# Patient Record
Sex: Male | Born: 1985
Health system: Southern US, Community
[De-identification: ages and names within clinical notes are randomized; demographics above are authoritative.]

## PROBLEM LIST (undated history)

## (undated) DIAGNOSIS — H409 Unspecified glaucoma: Secondary | ICD-10-CM

## (undated) DIAGNOSIS — F840 Autistic disorder: Secondary | ICD-10-CM

## (undated) HISTORY — DX: Unspecified glaucoma: H40.9

## (undated) HISTORY — PX: PATENT DUCTUS ARTERIOUS REPAIR: SHX269

---
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
## 2007-09-23 ENCOUNTER — Ambulatory Visit: Payer: Self-pay | Admitting: Pulmonary Disease

## 2007-09-23 DIAGNOSIS — J309 Allergic rhinitis, unspecified: Secondary | ICD-10-CM

## 2007-09-23 DIAGNOSIS — J4540 Moderate persistent asthma, uncomplicated: Secondary | ICD-10-CM

## 2007-10-01 ENCOUNTER — Ambulatory Visit: Payer: Self-pay | Admitting: Pulmonary Disease

## 2007-10-10 ENCOUNTER — Telehealth (INDEPENDENT_AMBULATORY_CARE_PROVIDER_SITE_OTHER): Payer: Self-pay | Admitting: *Deleted

## 2007-10-16 ENCOUNTER — Encounter (INDEPENDENT_AMBULATORY_CARE_PROVIDER_SITE_OTHER): Payer: Self-pay | Admitting: *Deleted

## 2007-12-19 ENCOUNTER — Ambulatory Visit: Payer: Self-pay | Admitting: Pulmonary Disease

## 2008-01-28 ENCOUNTER — Encounter: Payer: Self-pay | Admitting: Pulmonary Disease

## 2008-01-30 ENCOUNTER — Encounter: Payer: Self-pay | Admitting: Pulmonary Disease

## 2008-03-01 ENCOUNTER — Ambulatory Visit: Payer: Self-pay | Admitting: Pulmonary Disease

## 2008-06-02 ENCOUNTER — Ambulatory Visit: Payer: Self-pay | Admitting: Pulmonary Disease

## 2008-12-10 ENCOUNTER — Ambulatory Visit: Payer: Self-pay | Admitting: Pulmonary Disease

## 2009-08-30 ENCOUNTER — Ambulatory Visit (HOSPITAL_BASED_OUTPATIENT_CLINIC_OR_DEPARTMENT_OTHER): Admission: RE | Admit: 2009-08-30 | Discharge: 2009-08-30 | Payer: Self-pay | Admitting: Surgery

## 2010-02-09 ENCOUNTER — Ambulatory Visit (HOSPITAL_BASED_OUTPATIENT_CLINIC_OR_DEPARTMENT_OTHER): Admission: RE | Admit: 2010-02-09 | Discharge: 2010-02-09 | Payer: Self-pay | Admitting: Surgery

## 2010-09-12 ENCOUNTER — Ambulatory Visit
Admission: RE | Admit: 2010-09-12 | Discharge: 2010-09-12 | Payer: Self-pay | Source: Home / Self Care | Attending: Pulmonary Disease | Admitting: Pulmonary Disease

## 2010-09-20 NOTE — Assessment & Plan Note (Signed)
Summary: rov for asthma   Copy to:  Colonel Bald  CC:  Overdue for f/u appt for asthma.  Last seen April 2010.  Requesting rx on Symbicort.   Denies any complaints. .  History of Present Illness: the pt comes in today for f/u of his known asthma.  He has not been seen since 2010, and recently ran out of his symbicort with increased albuterol use.  When he stays on symbicort regularly, his symptoms are well controlled.  He denies any chest congestion or purulence.  He feels is exertional tolerance is stable.  Medications Prior to Update: 1)  Provigil 200 Mg  Tabs (Modafinil) .... Take 1 Tablet By Mouth Two Times A Day 2)  Symbicort 160-4.5 Mcg/act  Aero (Budesonide-Formoterol Fumarate) .... Two Puffs Twice Daily 3)  Travatan 0.004 % Soln (Travoprost) .Marland Kitchen.. 1 Drop in Each Eye Once Daily 4)  Proair Hfa 108 (90 Base) Mcg/act  Aers (Albuterol Sulfate) .... 2 Puffs Every 4-6 Hours As Needed  Allergies (verified): No Known Drug Allergies  Review of Systems  The patient denies shortness of breath with activity, shortness of breath at rest, productive cough, non-productive cough, coughing up blood, chest pain, irregular heartbeats, acid heartburn, indigestion, loss of appetite, weight change, abdominal pain, difficulty swallowing, sore throat, tooth/dental problems, headaches, nasal congestion/difficulty breathing through nose, sneezing, itching, ear ache, anxiety, depression, hand/feet swelling, joint stiffness or pain, rash, change in color of mucus, and fever.    Vital Signs:  Patient profile:   25 year old male Height:      67 inches Weight:      156.38 pounds BMI:     24.58 O2 Sat:      100 % on 75 Temp:     98.0 degrees F oral BP sitting:   100 / 70  (right arm) Cuff size:   regular  Vitals Entered By: Arman Filter LPN (September 12, 2010 11:16 AM)  O2 Flow:  75 CC: Overdue for f/u appt for asthma.  Last seen April 2010.  Requesting rx on Symbicort.   Denies any complaints.    Comments Medications reviewed with patient Arman Filter LPN  September 12, 2010 11:19 AM    Physical Exam  General:  wd male in nad Lungs:  one isolated wheeze LUL, o/w clear good airflow bilat. Heart:  rrr Extremities:  no edema or cyanosis  Neurologic:  alert and oriented, moves all 4.   Impression & Recommendations:  Problem # 1:  ASTHMA (ICD-493.90) the pt was doing well with symbicort, but had increased symptoms with increased albuterol use most recently since he ran out of his meds.  I have reiterated the importance of staying on his maintenance inhaler, and have given him refills for the next year.  He is to call me if he has breathing issues before the next visit.  Other Orders: Est. Patient Level III (10272)  Patient Instructions: 1)  stay on symbicort everyday. 2)  if you are having to use albuterol more than 2 times a week, let me know 3)  followup with me in one year.  Prescriptions: PROAIR HFA 108 (90 BASE) MCG/ACT  AERS (ALBUTEROL SULFATE) 2 puffs every 4-6 hours as needed  #1 x 12   Entered and Authorized by:   Barbaraann Share MD   Signed by:   Barbaraann Share MD on 09/12/2010   Method used:   Print then Give to Patient   RxID:   [id number] SYMBICORT 160-4.5 MCG/ACT  AERO (BUDESONIDE-FORMOTEROL FUMARATE) Two puffs twice daily  #1 x 12   Entered and Authorized by:   Barbaraann Share MD   Signed by:   Barbaraann Share MD on 09/12/2010   Method used:   Print then Give to Patient   RxID:   [id number]

## 2010-10-29 LAB — POCT HEMOGLOBIN-HEMACUE: Hemoglobin: 16 g/dL (ref 13.0–17.0)

## 2010-10-30 LAB — POCT HEMOGLOBIN-HEMACUE: Hemoglobin: 16 g/dL (ref 13.0–17.0)

## 2010-12-29 NOTE — Op Note (Signed)
NAME:  Grant [name], Grant [name]                      ACCOUNT NO.:  [id number]   MEDICAL RECORD NO.:  [id number]                   PATIENT TYPE:  AMB   LOCATION:  [facility]                                  FACILITY:  [facility]   PHYSICIAN:  Margit Banda. Jearld Fenton, M.D.                 DATE OF BIRTH:  1986/01/08   DATE OF PROCEDURE:  05/14/2002  DATE OF DISCHARGE:                                 OPERATIVE REPORT   PREOPERATIVE DIAGNOSIS:  Left vocal cord paralysis.   POSTOPERATIVE DIAGNOSIS:  Left vocal cord paralysis.   SURGICAL PROCEDURE:  Left laryngoplasty.   ANESTHESIA:  General endotracheal tube.   ESTIMATED BLOOD LOSS:  Less than 10 cc.   INDICATIONS:  This is a 25 year old who has had a long history of presumed  vocal cord paralysis as his voice has been bad as long as the parents can  remember.  He was seen in my office recently and found the vocal cord  paralysis and subsequently had workup with CT scan which showed no evidence  of any mass from base of skull to chest.  He now has had difficulty with  speaking and pronunciating, enunciating loudly.  He has difficulty speaking  on the phone.  The parents are very much interested in a laryngoplasty.  They were informed of the risks and benefits of the procedure including  bleeding, infection, extrusion of the implant, worsen hoarseness, airway  difficulty, and risk of the anesthetic.  All questions were answered and  consent was obtained.   OPERATION:  The patient was taken to the operating room and placed in the  supine position.  After adequate local with sedation, the patient had  ephedrine pledgets placed into the nose and fiberoptic examination to  examine the larynx which showed left vocal cord paralysis confirmation.  The  patient was then prepped and draped in the usual sterile manner.  Lidocaine,  1%, with 1:100,000 epinephrine was injected into the neck.   An incision was made in a skin crease just at the level of the thyroid  cartilage.  Dissection was carried down to the strap muscle.  The midline of  the strap muscles was divided and the perichondrium was removed off the  larynx on the left side.  A 5-mm x 1-cm box at the inferior base of the  larynx was created with a 15-blade.  The cartilage was very soft.  This  cartilage block was then removed and the perichondrium on the medial aspect  was elevated off the larynx and elevated medially.  An implant was then  fashioned with the Shaw scalpel to accommodate this opening.  The implant  was tested, placed in the site and, both with fiberoptic examination and  with phonation, it seemed to be excellent except for a little tightness  anteriorly.  This was trimmed further anteriorly to thin it so it would not  medialize the anterior portion of  the cord as much.  This was then replaced  and it was seen to have a good position with the vocal cord.  There was some  bruising of the vocal cord but no evidence of bleeding or hematoma.  The  implant was then secured to the anterior portion of the window with a 4-0  nylon suture.  The strap muscles were  then closed with interrupted chromic and the skin, with a rubber band drain  placed around the implant and out the skin, was secured with a 5-0 nylon and  interrupted 5-0 nylon used to close the skin.  The patient was then fully  awake and brought to recovery in stable condition.  Counts correct.                                               Margit Banda. Jearld Fenton, M.D.    JMB/MEDQ  D:  05/14/2002  T:  05/15/2002  Job:  [id number]   cc:   Deanna Artis. Sharene Skeans, M.D.  [address]  [place]  [place] [address]  Fax: [phone]

## 2011-09-12 ENCOUNTER — Encounter: Payer: Self-pay | Admitting: Pulmonary Disease

## 2011-09-13 ENCOUNTER — Ambulatory Visit: Payer: Self-pay | Admitting: Pulmonary Disease

## 2011-09-17 ENCOUNTER — Ambulatory Visit (INDEPENDENT_AMBULATORY_CARE_PROVIDER_SITE_OTHER): Payer: 59 | Admitting: Pulmonary Disease

## 2011-09-17 ENCOUNTER — Encounter: Payer: Self-pay | Admitting: Pulmonary Disease

## 2011-09-17 VITALS — BP 120/74 | HR 71 | Temp 98.5°F | Ht 67.0 in | Wt 159.6 lb

## 2011-09-17 DIAGNOSIS — J45909 Unspecified asthma, uncomplicated: Secondary | ICD-10-CM

## 2011-09-17 MED ORDER — BUDESONIDE-FORMOTEROL FUMARATE 160-4.5 MCG/ACT IN AERO: 2.0000 | INHALATION_SPRAY | Freq: Two times a day (BID) | RESPIRATORY_TRACT | Status: DC

## 2011-09-17 MED ORDER — ALBUTEROL SULFATE HFA 108 (90 BASE) MCG/ACT IN AERS: 2.0000 | INHALATION_SPRAY | Freq: Four times a day (QID) | RESPIRATORY_TRACT | Status: DC | PRN

## 2011-09-17 NOTE — Patient Instructions (Signed)
Continue your current medications Will send in your prescriptions for your asthma meds followup with me in one year, but call if having breathing issues.

## 2011-09-17 NOTE — Assessment & Plan Note (Signed)
The patient is doing very well from an asthma standpoint on his current regimen.  He rarely uses his rescue inhaler, and has not had any asthma flareups.  I have asked him to continue his current regimen, and to stay as active as possible.

## 2011-09-17 NOTE — Progress Notes (Signed)
  Subjective:    Patient ID: Grant [name], male    DOB: 08-22-1985, 26 y.o.   MRN: [id number]  HPI Patient comes in today for followup of his known asthma.  He has been staying on a good bronchodilator regimen, and feels that he rarely uses his rescue inhaler.  He denies any issues with his exertional tolerance, and has not had any acute exacerbations since the last visit here.   Review of Systems  Constitutional: Negative for fever and unexpected weight change.  HENT: Positive for congestion and rhinorrhea. Negative for ear pain, nosebleeds, sore throat, sneezing, trouble swallowing, dental problem, postnasal drip and sinus pressure.   Eyes: Negative for redness and itching.  Respiratory: Negative for cough, chest tightness, shortness of breath and wheezing.   Cardiovascular: Negative for palpitations and leg swelling.  Gastrointestinal: Negative for nausea and vomiting.  Genitourinary: Negative for dysuria.  Musculoskeletal: Negative for joint swelling.  Skin: Negative for rash.  Neurological: Negative for headaches.  Hematological: Does not bruise/bleed easily.  Psychiatric/Behavioral: Negative for dysphoric mood. The patient is not nervous/anxious.        Objective:   Physical Exam Well-developed male in no acute distress Nose without purulence or discharge noted Chest totally clear to auscultation, no wheezing Heart exam with regular rate and rhythm Lower extremities without edema, no cyanosis noted Alert and oriented, moves all 4 extremities.       Assessment & Plan:

## 2011-10-28 ENCOUNTER — Other Ambulatory Visit: Payer: Self-pay | Admitting: Pulmonary Disease

## 2011-11-09 DIAGNOSIS — G47429 Narcolepsy in conditions classified elsewhere without cataplexy: Secondary | ICD-10-CM | POA: Diagnosis not present

## 2011-11-09 DIAGNOSIS — Z5181 Encounter for therapeutic drug level monitoring: Secondary | ICD-10-CM | POA: Diagnosis not present

## 2012-09-16 ENCOUNTER — Ambulatory Visit: Payer: Medicare Other | Admitting: Pulmonary Disease

## 2012-12-09 ENCOUNTER — Other Ambulatory Visit: Payer: Self-pay | Admitting: Pulmonary Disease

## 2013-02-06 ENCOUNTER — Other Ambulatory Visit: Payer: Self-pay | Admitting: Neurology

## 2013-02-06 MED ORDER — MODAFINIL 200 MG PO TABS: ORAL_TABLET | ORAL | Status: DC

## 2013-03-06 ENCOUNTER — Telehealth: Payer: Self-pay | Admitting: Neurology

## 2013-03-29 ENCOUNTER — Other Ambulatory Visit: Payer: Self-pay | Admitting: Pulmonary Disease

## 2013-04-24 ENCOUNTER — Telehealth: Payer: Self-pay | Admitting: *Deleted

## 2013-04-24 NOTE — Telephone Encounter (Signed)
ATC patients mother, no answer LMOMTCB  Note: patient has not been seen in over 1 year (09/17/11)

## 2013-04-25 ENCOUNTER — Other Ambulatory Visit: Payer: Self-pay | Admitting: Pulmonary Disease

## 2013-04-25 ENCOUNTER — Other Ambulatory Visit: Payer: Self-pay | Admitting: Neurology

## 2013-04-27 ENCOUNTER — Telehealth: Payer: Self-pay | Admitting: Pulmonary Disease

## 2013-04-27 NOTE — Telephone Encounter (Signed)
lmomtcb x1 for pt. Pt is overdue for OV

## 2013-04-28 NOTE — Telephone Encounter (Signed)
LMTCB

## 2013-04-29 NOTE — Telephone Encounter (Signed)
ATC patients mother, no answer LMOMTCB

## 2013-04-29 NOTE — Telephone Encounter (Signed)
ATC patient no answer LMOMTCB 

## 2013-04-30 MED ORDER — BUDESONIDE-FORMOTEROL FUMARATE 160-4.5 MCG/ACT IN AERO: INHALATION_SPRAY | RESPIRATORY_TRACT | Status: DC

## 2013-04-30 NOTE — Telephone Encounter (Signed)
ATC patients mother NO answer LMOMTCB

## 2013-04-30 NOTE — Telephone Encounter (Signed)
LMOMTCB

## 2013-04-30 NOTE — Telephone Encounter (Signed)
Spoke with pt's mother who was very upset that pt has been out of symbicort for "weeks because we "haven't responded to the pharm."  Per phone msg from 04/24/13, our office had tried to contact pt's mother several times.  I advised her of this.  She was upset that we didn't leave msg.  I apologized for this but advised we do not have a DPR on file allowing [place] to leave a detailed msg.  Advised pt hasn't been seen by [facility] since 09/2011 at which time he was asked to f/u in 1 yr.  Pt has no pending OV, so I advised protocol on this.  Mother ok with scheduling OV with [facility] and is aware I will send symbicort to last until this OV, but pt will have to keep this OV for any additional rxs.  She verbalized understanding, is aware of pt's pending OV with [facility] on Jun 02, 2013 at 3:45 pm, and will call back if anything further is needed.  [facility]  Mother aware.

## 2013-05-01 NOTE — Telephone Encounter (Signed)
ATC patients mother at # provided no answer Also called numbers listed in Epic no answer [facility]  Per triage protocol will sign off as we have made several attempts to contact

## 2013-05-12 ENCOUNTER — Other Ambulatory Visit: Payer: Self-pay | Admitting: Neurology

## 2013-06-02 ENCOUNTER — Ambulatory Visit (INDEPENDENT_AMBULATORY_CARE_PROVIDER_SITE_OTHER): Payer: Medicare Other | Admitting: Pulmonary Disease

## 2013-06-02 ENCOUNTER — Encounter: Payer: Self-pay | Admitting: Pulmonary Disease

## 2013-06-02 VITALS — BP 130/68 | HR 84 | Temp 98.8°F | Ht 67.0 in | Wt 169.0 lb

## 2013-06-02 DIAGNOSIS — J45901 Unspecified asthma with (acute) exacerbation: Secondary | ICD-10-CM | POA: Diagnosis not present

## 2013-06-02 DIAGNOSIS — J45909 Unspecified asthma, uncomplicated: Secondary | ICD-10-CM | POA: Insufficient documentation

## 2013-06-02 MED ORDER — PREDNISONE 10 MG PO TABS: ORAL_TABLET | ORAL | Status: DC

## 2013-06-02 MED ORDER — AZITHROMYCIN 250 MG PO TABS: 250.0000 mg | ORAL_TABLET | ORAL | Status: DC

## 2013-06-02 NOTE — Patient Instructions (Signed)
Will get you started back on symbicort, 2 inhalations twice a day.  Keep mouth rinsed. zpak as directed for your bronchitis. Prednisone taper over 6 days to get you back to baseline.  followup with me in one year if doing well.

## 2013-06-02 NOTE — Assessment & Plan Note (Signed)
The pt has had increased sob, but also a hx that is suggestive of acute bronchitis.  Will treat with a course of abx, as well as a 6day taper of prednisone.  He is to let us know if not better.

## 2013-06-02 NOTE — Progress Notes (Signed)
  Subjective:    Patient ID: Grant [name], male    DOB: December 10, 1985, 27 y.o.   MRN: [id number]  HPI Patient comes in today for followup of his known asthma.  He unfortunately has run out of symbicort, but has been taking his family members Qvar in the interim.  Unfortunately, he has developed chest congestion, cough with some discolored mucus, and increasing shortness of breath.  He has not had any fever.   Review of Systems  Constitutional: Positive for fatigue. Negative for fever and unexpected weight change.  HENT: Negative for congestion, dental problem, ear pain, nosebleeds, postnasal drip, rhinorrhea, sinus pressure, sneezing, sore throat and trouble swallowing.   Eyes: Negative for redness and itching.  Respiratory: Positive for cough. Negative for chest tightness, shortness of breath and wheezing.   Cardiovascular: Negative for palpitations and leg swelling.  Gastrointestinal: Negative for nausea and vomiting.  Genitourinary: Negative for dysuria.  Musculoskeletal: Negative for joint swelling.  Skin: Positive for rash.  Neurological: Negative for headaches.  Hematological: Does not bruise/bleed easily.  Psychiatric/Behavioral: Negative for dysphoric mood. The patient is not nervous/anxious.        Objective:   Physical Exam Well developed male in no acute distress Nose without purulence or discharge noted Neck without lymphadenopathy or thyromegaly Chest with a few rhonchi, and adequate air flow, no active wheezing Cardiac exam with regular rate and rhythm Lower extremities without edema, no cyanosis Alert and oriented, moves all 4 extremities.       Assessment & Plan:

## 2013-06-08 ENCOUNTER — Other Ambulatory Visit: Payer: Self-pay | Admitting: Neurology

## 2013-06-21 ENCOUNTER — Other Ambulatory Visit: Payer: Self-pay | Admitting: Neurology

## 2013-06-24 ENCOUNTER — Telehealth: Payer: Self-pay | Admitting: Neurology

## 2013-06-25 ENCOUNTER — Other Ambulatory Visit: Payer: Self-pay | Admitting: Neurology

## 2013-06-25 MED ORDER — MODAFINIL 200 MG PO TABS: ORAL_TABLET | ORAL | Status: DC

## 2013-06-25 NOTE — Telephone Encounter (Signed)
[phone]. °

## 2013-09-19 ENCOUNTER — Other Ambulatory Visit: Payer: Self-pay | Admitting: Pulmonary Disease

## 2013-10-14 ENCOUNTER — Encounter: Payer: Self-pay | Admitting: *Deleted

## 2013-11-04 ENCOUNTER — Encounter: Payer: Self-pay | Admitting: Neurology

## 2013-11-04 ENCOUNTER — Ambulatory Visit (INDEPENDENT_AMBULATORY_CARE_PROVIDER_SITE_OTHER): Payer: 59 | Admitting: Neurology

## 2013-11-04 VITALS — BP 120/74 | HR 67 | Resp 16 | Ht 69.75 in | Wt 166.0 lb

## 2013-11-04 DIAGNOSIS — Z9989 Dependence on other enabling machines and devices: Secondary | ICD-10-CM

## 2013-11-04 DIAGNOSIS — G47419 Narcolepsy without cataplexy: Secondary | ICD-10-CM

## 2013-11-04 MED ORDER — MODAFINIL 200 MG PO TABS: ORAL_TABLET | ORAL | Status: DC

## 2013-11-04 NOTE — Patient Instructions (Signed)
Narcolepsy Narcolepsy is a disabling neurological disorder of sleep regulation. It affects the control of sleep. It also affects the control of wakefulness. It is an interruption of the dreaming state of sleep. This state is known as REM or rapid eye movement sleep.  SYMPTOMS  The development, number, and severity of symptoms vary widely among people with the disorder. Symptoms generally begin between the ages of 15 and 30. The four classic symptoms of the disorder are:   Excessive daytime sleepiness.  Cataplexy. This is sudden, brief episodes of muscle weakness or paralysis. It is caused by strong emotions. Common strong emotions are laughter, anger, surprise, or anticipation.  Sleep paralysis. This is paralysis upon falling asleep or waking up.  Hallucinations. These are vivid dream-like images that occur at when you first fall asleep. Other symptoms include:   Unrelenting excessive sleepiness. This is usually the first and most obvious symptom.  Sleep attacks. Patients have strong sleep attacks throughout the day. These attacks can last for 30 seconds to more than 30 minutes. These happen no matter how much or how well the person slept the night before. These attacks end up making the person sleep at work and social events. The person can fall asleep while eating, talking, and driving. They also fall asleep at other out of place times.  Disturbed nighttime sleep.  Tossing and turning in bed.  Leg jerks.  Nightmares.  Waking up often. DIAGNOSIS  It's possible that genetics play a role in this disorder. Narcolepsy is not a rare disorder. It is often misdiagnosed. It is often diagnosed years after symptoms first appear. Early diagnosis and treatment are important. This help the physical and mental well-being of the patient. TREATMENT  There is no cure for narcolepsy. The symptoms can be controlled with behavioral and medical therapy. The excessive daytime sleepiness may be treated with  stimulant drugs. It may also be treated with the drug modafinil (Provigil). Cataplexy and other REM-sleep symptoms may be treated with antidepressant medications. Medications will reduce the symptoms. Medications will not ease symptoms entirely. Many available medications have side effects. Basic lifestyle changes may also reduce the symptoms. These changes include having regular sleep schedules and scheduled daytime naps. Other lifestyle changes include avoiding "over-stimulating" situations. Document Released: 07/20/2002 Document Revised: 10/22/2011 Document Reviewed: 07/30/2005 ExitCare Patient Information 2014 [employer].  

## 2013-11-04 NOTE — Progress Notes (Signed)
[facility]  Provider:  Melvyn Novasarmen  Aerie Donica, M D  Referring Provider: Baxter HireHicks, Roselyn M, MD Primary Care Physician:  Baxter HireHICKS,ROSELYN M, MD    HPI:  Grant [name] is a 28 y.o. male  Is seen here as a referral/ revisit  from Dr. Willa RoughHicks for followup on narcolepsy without cataplexy.    The patient has been followed since 2008 and in March-19-2008 underwent a diagnostic polysomnogram which showed an apnea AHI of 0.0 the study was valid 1 and S. ability to follow the MSL T, based on an Epworth sleepiness score of 18 points, thus  indicative of narcolepsy.   The patient has been treated with Provigil since ,  200mg   milligram one tablet in the morning one half tablet right afternoon.  The patient had no ccataplectic attacks in the past. That is last visit in 2013 this is his first visit on the Epic system.   Todays Epworth sleepiness score was endorsed at 5 points and his fatigue severity score of 27 points.       Review of Systems: Out of a complete 14 system review, the patient complains of only the following symptoms, and all other reviewed systems are negative. On provigil nasal drip but no headaches. MRDD  No insomnia on  medication average sleep duration is 9 hours . Nocturia. He snores    History   Social History  . Marital Status: Single    Spouse Name: N/A    Number of Children: 0  . Years of Education: College   Occupational History  . works part time at [employer]   .     Social History Main Topics  . Smoking status: Never Smoker   . Smokeless tobacco: Never Used  . Alcohol Use: No  . Drug Use: No  . Sexual Activity: Not on file   Other Topics Concern  . Not on file   Social History Narrative   Patient is single and lives with his parents.   Patient does not drink any caffeine.   Patient is right-handed.    Family History  Problem Relation Age of Onset  . Allergies Other     siblings  . Asthma Other     siblings  . Seizures Father      Past Medical History  Diagnosis Date  . Asthma   . Allergic rhinitis   . Narcolepsy   . Premature delivery     Born at 21 weeks of gestation  . 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 Patent ductus arterious repair      Current Outpatient Prescriptions  Medication Sig Dispense Refill  . albuterol (PROAIR HFA) 108 (90 BASE) MCG/ACT inhaler Inhale 2 puffs into the lungs every 6 (six) hours as needed.  1 Inhaler  5  . azithromycin (ZITHROMAX) 250 MG tablet Take 1 tablet (250 mg total) by mouth as directed.  6 tablet  0  . modafinil (PROVIGIL) 200 MG tablet TAKE 1 TABLET BY MOUTH EVERY MORNING AND AN ADDITIONAL 1/2 TABLET IN THE AFTERNOON BETWEEN 12 AND 3P  135 tablet  3  . predniSONE (DELTASONE) 10 MG tablet Take 4 tabs po x 2 days, then 2 x 2 days, then 1 x 2 days then stop.  14 tablet  0  . SYMBICORT 160-4.5 MCG/ACT inhaler INHALE 2 PUFFS BY MOUTH TWICE DAILY  1 Inhaler  7  . travoprost,  benzalkonium, (TRAVATAN) 0.004 % ophthalmic solution Place 1 drop into both eyes at bedtime.       No current facility-administered medications for this visit.    Allergies as of 11/04/2013  . (No Known Allergies)    Vitals: BP 120/74  Pulse 67  Resp 16  Ht 5' 9.75" (1.772 m)  Wt 166 lb (75.297 kg)  BMI 23.98 kg/m2 Last Weight:  Wt Readings from Last 1 Encounters:  11/04/13 166 lb (75.297 kg)   Last Height:   Ht Readings from Last 1 Encounters:  11/04/13 5' 9.75" (1.772 m)    Physical exam:  General: The patient is awake, alert and appears not in acute distress. The patient is well groomed. Head: Normocephalic, atraumatic. Neck is supple. Mallampati 3 , neck circumference: 16.5 , no TMJ pain.  Cardiovascular:  Regular rate and rhythm, without  murmurs or carotid bruit, and without distended neck veins. Respiratory: Lungs are clear to auscultation. Skin:  Without evidence of edema, or  rash Trunk: BMI is elevated and patient  has normal posture.  Neurologic exam : The patient is awake and alert, with a normal attention span & concentration ability. Speech is fluent with  dysphonia not  aphasia. Mood and affect are appropriate.  Cranial nerves: Pupils are equal and briskly reactive to light. Funduscopic exam without  evidence of pallor or edema. Extraocular movements  in vertical and horizontal planes intact and without nystagmus. Visual fields by finger perimetry are intact. Hearing to finger rub intact.  Facial sensation intact to fine touch. Facial motor strength is symmetric and tongue and uvula move midline.  Motor exam:    Normal tone and normal muscle bulk and symmetric normal strength in all extremities.  Sensory:  Fine touch, pinprick and vibration were tested in all extremities. Proprioception  normal.  Coordination: Rapid alternating movements in the fingers/hands is tested and normal. Finger-to-nose maneuver tested and normal without evidence of ataxia, dysmetria or tremor.  Gait and station: Patient walks without assistive device  Deep tendon reflexes: in the  upper and lower extremities are symmetric and intact. Babinski maneuver response is downgoing.   Assessment:  After physical and neurologic examination, review of laboratory studies, imaging, neurophysiology testing and pre-existing records, assessment is :  Narcolepsy , epworth reduction  Nuvigil , 200 mg divided bid .   Plan:  Treatment plan and additional workup : refill.

## 2013-11-24 ENCOUNTER — Other Ambulatory Visit: Payer: Self-pay | Admitting: *Deleted

## 2013-11-24 NOTE — Telephone Encounter (Signed)
symbicort and qvar are totally different medications.  See if dulera, advair, or breo are on formulary.

## 2013-11-24 NOTE — Telephone Encounter (Signed)
Symbicort is no longer covered by patient insurance. Requires PA Alternative medication approved by insurance is QVAR  Please advise Dr Shelle Ironlance. Thanks.

## 2013-11-25 MED ORDER — FLUTICASONE-SALMETEROL 250-50 MCG/DOSE IN AEPB: 1.0000 | INHALATION_SPRAY | Freq: Two times a day (BID) | RESPIRATORY_TRACT | Status: DC

## 2013-11-25 NOTE — Telephone Encounter (Signed)
[facility] with Schering[facility]oughCrystal[facility] [address] Advair 250 is covered for patient -- cost $3.60 per 30day supply.  Please advise if okay to make change and sig. Thanks.

## 2013-11-26 NOTE — Telephone Encounter (Signed)
Rx Advair 250 1 puff BID #60 x 6 refills [facility] [address]. Pt aware of this switch.

## 2014-05-20 ENCOUNTER — Other Ambulatory Visit: Payer: Self-pay

## 2014-05-20 DIAGNOSIS — G47419 Narcolepsy without cataplexy: Secondary | ICD-10-CM

## 2014-05-20 MED ORDER — MODAFINIL 200 MG PO TABS: ORAL_TABLET | ORAL | Status: DC

## 2014-05-21 NOTE — Telephone Encounter (Signed)
Rx signed and faxed.

## 2014-06-02 ENCOUNTER — Ambulatory Visit: Payer: Medicare Other | Admitting: Pulmonary Disease

## 2014-06-03 ENCOUNTER — Encounter: Payer: Self-pay | Admitting: Pulmonary Disease

## 2014-06-03 ENCOUNTER — Encounter (INDEPENDENT_AMBULATORY_CARE_PROVIDER_SITE_OTHER): Payer: Self-pay

## 2014-06-03 ENCOUNTER — Ambulatory Visit (INDEPENDENT_AMBULATORY_CARE_PROVIDER_SITE_OTHER): Payer: 59 | Admitting: Pulmonary Disease

## 2014-06-03 VITALS — BP 122/72 | HR 62 | Temp 97.8°F | Ht 67.0 in | Wt 164.8 lb

## 2014-06-03 DIAGNOSIS — J4540 Moderate persistent asthma, uncomplicated: Secondary | ICD-10-CM | POA: Diagnosis not present

## 2014-06-03 NOTE — Assessment & Plan Note (Signed)
The patient is doing well from an asthma standpoint, with no acute exacerbation and no significant symptoms. I have asked him to stay on his maintenance medication, and to let us know if he is having to overuse his rescue inhaler. I will see him back in one year.

## 2014-06-03 NOTE — Progress Notes (Signed)
   Subjective:    Patient ID: Grant [name], male    DOB: 21-Mar-1986, 28 y.o.   MRN: [id number]  HPI The patient comes in today for followup of his known asthma. He is staying on his maintenance medication consistently, and has not had an acute exacerbation since last visit. He rarely ever uses his rescue inhaler, and feels that his exertional tolerance is normal. He is having some fall allergies, and I've asked him to try an over-the-counter antihistamine.   Review of Systems  Constitutional: Negative for fever and unexpected weight change.  HENT: Positive for postnasal drip. Negative for congestion, dental problem, ear pain, nosebleeds, rhinorrhea, sinus pressure, sneezing, sore throat and trouble swallowing.   Eyes: Negative for redness and itching.  Respiratory: Negative for cough, chest tightness, shortness of breath and wheezing.   Cardiovascular: Negative for palpitations and leg swelling.  Gastrointestinal: Negative for nausea and vomiting.  Genitourinary: Negative for dysuria.  Musculoskeletal: Negative for joint swelling.  Skin: Negative for rash.  Neurological: Negative for headaches.  Hematological: Does not bruise/bleed easily.  Psychiatric/Behavioral: Negative for dysphoric mood. The patient is not nervous/anxious.        Objective:   Physical Exam Well-developed male in no acute distress Nose without purulence or discharge noted Neck without lymphadenopathy or thyromegaly Chest totally clear to auscultation, no wheeze Cardiac exam with regular rate and rhythm Lower extremities without edema, no cyanosis Alert and oriented, moves all 4 extremities       Assessment & Plan:

## 2014-06-03 NOTE — Patient Instructions (Signed)
Stay on advair one inhalation twice a day. Keep mouth rinsed. followup with me again in one year.

## 2014-08-15 ENCOUNTER — Other Ambulatory Visit: Payer: Self-pay | Admitting: Pulmonary Disease

## 2014-10-20 ENCOUNTER — Telehealth: Payer: Self-pay

## 2014-10-20 NOTE — Telephone Encounter (Signed)
Called and left a message with patient. Stating we need to reschedule his apt with Dr.Dohmeier on 12-01-14. Arrive at 3:15. Please when patient call's back if Grant MilletMegan has a slot open he can go on her schedule. Thanks Annabelle Harmanana . Dohmeier/[name].

## 2014-11-12 ENCOUNTER — Encounter: Payer: Self-pay | Admitting: Adult Health

## 2014-11-12 ENCOUNTER — Ambulatory Visit (INDEPENDENT_AMBULATORY_CARE_PROVIDER_SITE_OTHER): Payer: 59 | Admitting: Adult Health

## 2014-11-12 VITALS — BP 112/76 | HR 76 | Ht 69.0 in | Wt 161.0 lb

## 2014-11-12 DIAGNOSIS — G47419 Narcolepsy without cataplexy: Secondary | ICD-10-CM | POA: Diagnosis not present

## 2014-11-12 NOTE — Progress Notes (Signed)
I agree with the assessment , but will  Plan to see this patient in June 2016 , not May  as directed by NP .The patient is known to me .   Jourden Gilson, MD

## 2014-11-12 NOTE — Progress Notes (Signed)
PATIENT: Grant [name] DOB: [birth year]  REASON FOR VISIT: follow up- narcolepsy HISTORY FROM: patient  HISTORY OF PRESENT ILLNESS: Grant [name] is a [age] year old male with a history of narcolepsy. He returns today for follow-up. The patient is currently taking Nuvigil 200 mg in the morning and half a tablet in the afternoon. He reports that this has worked well for him in the past but now he is noticing that he is getting sleepy again. He states that he normally takes his first dose around 6:30 in the morning. He has to be in class at 8 AM. He takes a second dose at 12 PM. He states for 5 PM in the afternoon he is very sleepy. He states that he does not consistently fall asleep in class but that has happened in the past. He states that if he does get sleepy he will get up and walk around to stay awake. He states that he feels the most tired in the afternoons. He does not operate a motor vehicle. He is currently not working but going to school full-time. His Epworth score today is 23 was previously 5 points. His mother does report that if he forgets to take his medication he will follow sleep easily. She states that church he forgot to take his medication and was falling asleep standing up. His mother also reports that she has noticed some forgetfulness. Unsure if this was related to the Nuvigil. Patient denies any depression or anxiety. Patient feels that his concentration is not as good as it used to be. He returns today for evaluation.  HISTORY 11/04/13 [facility](DOHMEIER): Grant [name] is a 29 y.o. male Is seen here as a referral/ revisit from Dr. Willa RoughHicks for followup on narcolepsy without cataplexy. The patient has been followed since 2008 and in March-19-2008 underwent a diagnostic polysomnogram which showed an apnea AHI of 0.0 the study was valid 1 and S. ability to follow the MSL T, based on an Epworth sleepiness score of 18 points, thus indicative of narcolepsy.  The patient has been  treated with Provigil since , 200mg  milligram one tablet in the morning one half tablet right afternoon. The patient had no ccataplectic attacks in the past. That is last visit in 2013 this is his first visit on the Epic system.  Todays Epworth sleepiness score was endorsed at 5 points and his fatigue severity score of 27 points.  REVIEW OF SYSTEMS: Out of a complete 14 system review of symptoms, the patient complains only of the following symptoms, and all other reviewed systems are negative.  Decreased concentration  ALLERGIES: No Known Allergies  HOME MEDICATIONS: Outpatient Prescriptions Prior to Visit  Medication Sig Dispense Refill  . ADVAIR DISKUS 250-50 MCG/DOSE AEPB INHALE 1 PUFF INTO THE LUNGS 2 (TWO) TIMES DAILY. 60 each 6  . albuterol (PROAIR HFA) 108 (90 BASE) MCG/ACT inhaler Inhale 2 puffs into the lungs every 6 (six) hours as needed. 1 Inhaler 5  . modafinil (PROVIGIL) 200 MG tablet TAKE 1 TABLET BY MOUTH EVERY MORNING AND AN ADDITIONAL 1/2 TABLET IN THE AFTERNOON BETWEEN 12 AND 3P 135 tablet 1  . travoprost, benzalkonium, (TRAVATAN) 0.004 % ophthalmic solution Place 1 drop into both eyes at bedtime.     No facility-administered medications prior to visit.    PAST MEDICAL HISTORY: Past Medical History  Diagnosis Date  . Asthma   . Allergic rhinitis   . Narcolepsy   . Premature delivery     Born at 7421  weeks of gestation  . Early stage glaucoma     Secondary to complications of prematurity    PAST SURGICAL HISTORY: Past Surgical History  Procedure Laterality Date  . Patent ductus arterious repair      FAMILY HISTORY: Family History  Problem Relation Age of Onset  . Allergies Other     siblings  . Asthma Other     siblings  . Seizures Father     SOCIAL HISTORY: History   Social History  . Marital Status: Single    Spouse Name: N/A  . Number of Children: 0  . Years of Education: College   Occupational History  . works part time at [employer]   .     Social History Main Topics  . Smoking status: Never Smoker   . Smokeless tobacco: Never Used  . Alcohol Use: No  . Drug Use: No  . Sexual Activity: Not on file   Other Topics Concern  . Not on file   Social History Narrative   Patient is single and lives with his parents.   Patient does not drink any caffeine.   Patient is right-handed.      PHYSICAL EXAM  Filed Vitals:   11/12/14 0815  BP: 112/76  Pulse: 76  Height:  (1.753 m)  Weight: 161 lb (73.029 kg)   Body mass index is 23.76 kg/(m^2).  Generalized: Well developed, in no acute distress   Neurological examination  Mentation: Alert oriented to time, place, history taking. Follows all commands speech and language fluent. MMSE 30/30 Cranial nerve II-XII: Pupils were equal round reactive to light. Extraocular movements were full, visual field were full on confrontational test. Facial sensation and strength were normal. Uvula tongue midline. Head turning and shoulder shrug  were normal and symmetric. Motor: The motor testing reveals 5 over 5 strength of all 4 extremities. Good symmetric motor tone is noted throughout.  Sensory: Sensory testing is intact to soft touch on all 4 extremities. No evidence of extinction is noted.  Coordination: Cerebellar testing reveals good finger-nose-finger and heel-to-shin bilaterally.  Gait and station: Gait is normal. Tandem gait is normal. Romberg is negative. No drift is seen.  Reflexes: Deep tendon reflexes are symmetric and normal bilaterally.  Marland Kitchen   DIAGNOSTIC DATA (LABS, IMAGING, TESTING) - I reviewed patient records, labs, notes, testing and imaging myself where available.  ASSESSMENT AND PLAN 29 y.o. year old male  has a past medical history of Asthma; Allergic rhinitis; Narcolepsy; Premature delivery; and Early stage glaucoma. here with:  1. Narcolepsy  The patient's sleepiness and epworth  score has increased since the last visit. We will adjust the  times that he takes the Nuvigil. He will take the morning dose between 7 and 7:30 AM and his second dose around 2 PM he will let me know if this improves his sleepiness in the afternoons. If his sleepiness does not improve we will have to consider another medication. The patient and his mom will call and let us know how the time change is working. We completed a MMSE today on the patient his score was 30/30. We will continue to monitor this. He will follow-up in May with Dr. Vickey Huger.  Butch Penny, MSN, NP-C 11/12/2014, 8:10 AM [facility] [address] [place], [place] [address] [phone]  Note: This document was prepared with digital dictation and possible smart phrase technology. Any transcriptional errors that result from this process are unintentional.

## 2014-11-12 NOTE — Patient Instructions (Signed)
Try taking your first dose of nuvigil between 7-7:30 and the second dose at 2:00PM.  If this does not improve your sleepiness we will need to consider another medication.

## 2014-12-01 ENCOUNTER — Ambulatory Visit: Payer: Medicaid Other | Admitting: Neurology

## 2014-12-10 ENCOUNTER — Telehealth: Payer: Self-pay | Admitting: Neurology

## 2014-12-10 DIAGNOSIS — G47419 Narcolepsy without cataplexy: Secondary | ICD-10-CM

## 2014-12-10 MED ORDER — MODAFINIL 200 MG PO TABS: ORAL_TABLET | ORAL | Status: DC

## 2014-12-10 NOTE — Telephone Encounter (Signed)
Grant [name], pt's mother called requesting a refill for modafinil (PROVIGIL) 200 MG tablet. [facility] [address]  Grant [name]'s c/b is [phone]

## 2014-12-10 NOTE — Telephone Encounter (Signed)
Request entered, forwarded to provider for approval.  

## 2014-12-13 ENCOUNTER — Telehealth: Payer: Self-pay | Admitting: Neurology

## 2014-12-13 NOTE — Telephone Encounter (Signed)
Licette, pt's mother called stating that [facility] called stating that the patient is unable to receive his script for modafinil (PROVIGIL) 200 MG tablet.due to medication not lasting long enough for the patient. The patient has been out of the script for over a week now. Please call and advice # [phone]

## 2014-12-13 NOTE — Telephone Encounter (Signed)
I called the pharmacy to clarify.  Spoke with Ree KidaJack.  He said the patient last got a 90 day Rx in mid Feb, so it is refill too soon until May 12th.  Since this is a controlled substance drug, they are unable to fill it early.  I called mom back.  Got no answer.  Left message relaying the info provided by pharmacy.  Also inquired if patient is taking medication differently than prescribed.  If so, this could be the reason it is refill too soon.  Pending return call to clarify this info.

## 2014-12-14 NOTE — Telephone Encounter (Signed)
Mom called back and indicated although Modafinil Rx is written for one tab in am and one half tab in afternoon, he has been taking one full tab twice daily.  Because the med has been taken differently than prescribed, pharmacy will not refill it at this time (too soon).  Would you like to change Rx to one full tab twice daily?  Please advise.  Thank you.

## 2014-12-14 NOTE — Telephone Encounter (Signed)
Rx is written for one tab in am and one half tab in afternoon.  Since patient is taking med differently than prescribed, the pharmacy will not refill med at this time.  There is a separate note open regarding this.  A message has been forwarded to the provider asking if she would like to change dose.  I called Ms Grant [name] back, got no answer.  Left message.

## 2014-12-14 NOTE — Telephone Encounter (Signed)
Licette, pt's mother is calling back regarding modafinil 200 mg tablets.  She states the patient has been taking 1 tablet morning, 1 tablet noon. She states he has been out quite a while. Could you please call her to discuss [phone].

## 2014-12-15 ENCOUNTER — Telehealth: Payer: Self-pay

## 2014-12-15 DIAGNOSIS — G47419 Narcolepsy without cataplexy: Secondary | ICD-10-CM

## 2014-12-15 MED ORDER — MODAFINIL 200 MG PO TABS: 200.0000 mg | ORAL_TABLET | Freq: Two times a day (BID) | ORAL | Status: DC

## 2014-12-15 NOTE — Telephone Encounter (Signed)
Called mother to inform her that as soon as Dr. Vickey Hugerohmeier is available I will discuss a new rx for her son, Grant [name]. Pt verbalized understanding.

## 2014-12-15 NOTE — Telephone Encounter (Signed)
I discussed Mr. Grant [name] with my RN, nurse Dinkins. , I will refill the Nuvigil for 2 tablets a day since this has helped Mr. Grant [name] to stay gainfully employed.

## 2014-12-15 NOTE — Telephone Encounter (Signed)
[facility] Spoke to mother and informed her.

## 2014-12-15 NOTE — Telephone Encounter (Signed)
Patients mom returned call and is very upset that she has not been called back. States that her son has been without medication for a week now. Would like for someone to call her immediately. Please call and advise.

## 2014-12-15 NOTE — Addendum Note (Signed)
Addended by: Melvyn NovasHMEIER, Annaliesa Blann on: 12/15/2014 05:14 PM   Modules accepted: Orders

## 2014-12-16 ENCOUNTER — Telehealth: Payer: Self-pay

## 2014-12-16 ENCOUNTER — Other Ambulatory Visit: Payer: Self-pay

## 2014-12-16 MED ORDER — MODAFINIL 200 MG PO TABS: 400.0000 mg | ORAL_TABLET | Freq: Every day | ORAL | Status: DC

## 2014-12-16 NOTE — Telephone Encounter (Signed)
Called pt's mother to confirm that they got the rx for provigil. She said they had not heard anything from the pharmacy. [facility] said [facility] but will not fill it since the directions still stay take 1.5 tablets daily (it needs to say 2 tablets daily). It would be too early to fill the rx with the current directions. Will ask Dr. Vickey Hugerohmeier for a new script.

## 2014-12-16 NOTE — Telephone Encounter (Signed)
[facility] [address] Correct rx is provigil 200 tablets take 1 in am and 1 in afternoon.

## 2014-12-16 NOTE — Telephone Encounter (Signed)
Please tell his mom we send the presciption.  We have doubled his Provigil medication for the day and yesterday morning a prescription for 400 mg Provigil total per day went out. This is above what is recommended, and the pharmacy may encounter difficulties with the pain. However since 1-1/2 tablets,theequivalentof300mg  were permitted to be prescribed I would have otherwise needed to reduce Grant [name]'s dose to a level where he seems not to function well. I'm awaiting the response from the insurance . CD

## 2014-12-24 ENCOUNTER — Ambulatory Visit: Payer: Medicare Other | Admitting: Neurology

## 2014-12-27 ENCOUNTER — Encounter: Payer: Self-pay | Admitting: Neurology

## 2015-03-11 ENCOUNTER — Emergency Department (INDEPENDENT_AMBULATORY_CARE_PROVIDER_SITE_OTHER)
Admission: EM | Admit: 2015-03-11 | Discharge: 2015-03-11 | Disposition: A | Discharge status: Home / Self Care | Payer: 59 | Source: Home / Self Care | Attending: Family Medicine | Admitting: Family Medicine

## 2015-03-11 ENCOUNTER — Encounter (HOSPITAL_COMMUNITY): Payer: Self-pay | Admitting: Emergency Medicine

## 2015-03-11 DIAGNOSIS — L02412 Cutaneous abscess of left axilla: Secondary | ICD-10-CM | POA: Diagnosis not present

## 2015-03-11 MED ORDER — DOXYCYCLINE HYCLATE 100 MG PO CAPS: 100.0000 mg | ORAL_CAPSULE | Freq: Two times a day (BID) | ORAL | Status: DC

## 2015-03-11 NOTE — ED Provider Notes (Signed)
CSN: [id number]     Arrival date & time 03/11/15  1743 History   First MD Initiated Contact with Patient 03/11/15 1834     Chief Complaint  Patient presents with  . Abscess   (Consider location/radiation/quality/duration/timing/severity/associated sxs/prior Treatment) Patient is a 29 y.o. male presenting with abscess. The history is provided by the patient and a parent.  Abscess Location:  Shoulder/arm Shoulder/arm abscess location:  L axilla Abscess quality: draining and redness   Red streaking: no   Duration:  5 days Progression:  Improving Chronicity:  New Relieved by:  Draining/squeezing Associated symptoms: no fever   Risk factors: prior abscess     Past Medical History  Diagnosis Date  . Asthma   . Allergic rhinitis   . Narcolepsy   . Premature delivery     Born at 21 weeks of gestation  . Early stage glaucoma     Secondary to complications of prematurity   Past Surgical History  Procedure Laterality Date  . Patent ductus arterious repair     Family History  Problem Relation Age of Onset  . Allergies Other     siblings  . Asthma Other     siblings  . Seizures Father    History  Substance Use Topics  . Smoking status: Never Smoker   . Smokeless tobacco: Never Used  . Alcohol Use: No    Review of Systems  Constitutional: Negative.  Negative for fever.  Skin: Positive for wound.    Allergies  Review of patient's allergies indicates no known allergies.  Home Medications   Prior to Admission medications   Medication Sig Start Date End Date Taking? Authorizing Provider  ADVAIR DISKUS 250-50 MCG/DOSE AEPB INHALE 1 PUFF INTO THE LUNGS 2 (TWO) TIMES DAILY. 08/16/14   Barbaraann Share, MD  albuterol (PROAIR HFA) 108 (90 BASE) MCG/ACT inhaler Inhale 2 puffs into the lungs every 6 (six) hours as needed. 09/17/11   Barbaraann Share, MD  doxycycline (VIBRAMYCIN) 100 MG capsule Take 1 capsule (100 mg total) by mouth 2 (two) times daily. 03/11/15   Linna Hoff, MD   modafinil (PROVIGIL) 200 MG tablet Take 1 tablet (200 mg total) by mouth 2 (two) times daily. TAKE 1 TABLET BY MOUTH EVERY MORNING AND AN ADDITIONAL 1/2 TABLET IN THE AFTERNOON BETWEEN 12 AND 3P 12/15/14   Melvyn Novas, MD  modafinil (PROVIGIL) 200 MG tablet Take 2 tablets (400 mg total) by mouth daily. Take 1 tablet by mouth in the morning and 1 tablet between 12 and 3 in the afternoon. 12/16/14   Porfirio Mylar Dohmeier, MD  travoprost, benzalkonium, (TRAVATAN) 0.004 % ophthalmic solution Place 1 drop into both eyes at bedtime.    Historical Provider, MD   BP 126/82 mmHg  Pulse 66  Temp(Src) 98.2 F (36.8 C) (Oral)  Resp 16  SpO2 98% Physical Exam  Constitutional: He appears well-developed and well-nourished.  Musculoskeletal: He exhibits no tenderness.  Neurological: He is alert.  Skin: Skin is warm and dry. There is erythema.  Abscess left axilla still sl fluctuance  Nursing note and vitals reviewed.   ED Course  INCISION AND DRAINAGE Date/Time: 03/11/2015 6:46 PM Performed by: Linna Hoff Authorized by: Bradd Canary D Consent: Verbal consent obtained. Risks and benefits: risks, benefits and alternatives were discussed Consent given by: patient Type: abscess Body area: trunk Location details: chest Local anesthetic: topical anesthetic Patient sedated: no Scalpel size: 11 Incision type: single straight Complexity: simple Wound treatment: wound left open Patient  tolerance: Patient tolerated the procedure well with no immediate complications   (including critical care time) Labs Review Labs Reviewed - No data to display  Imaging Review No results found.   MDM   1. Abscess of axilla, left        Linna Hoff, MD 03/11/15 [phone]

## 2015-03-11 NOTE — ED Notes (Signed)
C/o abscess left axilla.  Noticed this one 2 weeks ago.  Abscess reoccurence for the past 3 years, intermittent

## 2015-03-11 NOTE — Discharge Instructions (Signed)
Warm compress twice a day when you take the antibiotic, take all of medicine, return as needed. °

## 2015-03-31 ENCOUNTER — Encounter: Payer: Self-pay | Admitting: Pulmonary Disease

## 2015-06-09 NOTE — Telephone Encounter (Signed)
Error

## 2015-06-10 ENCOUNTER — Ambulatory Visit: Payer: 59 | Admitting: Pulmonary Disease

## 2015-09-06 ENCOUNTER — Telehealth: Payer: Self-pay

## 2015-09-06 NOTE — Telephone Encounter (Signed)
Once pt calls back, please make a 30 minute office visit. If you have called 3 times, a letter should be sent to the pt asking them to call the office.

## 2015-09-06 NOTE — Telephone Encounter (Signed)
Received a refill request for pt's modafinil. Pt was instructed to see Dr. Vickey Huger in June of 2016. He needs a to make an appointment with Dr. Vickey Huger so we may refill this medication. Please call and advise pt's mother of this and make a 30 minute office visit or 30 minute follow up appt.

## 2015-09-06 NOTE — Telephone Encounter (Signed)
Called left voicemail to call back to make a 30 minute appt with Dr. Vickey Huger in order to get his RX refilled...please advise

## 2015-09-08 ENCOUNTER — Other Ambulatory Visit: Payer: Self-pay | Admitting: *Deleted

## 2015-09-08 NOTE — Telephone Encounter (Signed)
Patient's Mother is calling to get a refill for modafinil (PROVIGIL) 200 MG tablet for the patient. The patient does have an appointment scheduled on 09-29-15 with Dr. Vickey Huger. [facility] [address] Thank you.

## 2015-09-09 ENCOUNTER — Other Ambulatory Visit: Payer: Self-pay

## 2015-09-09 MED ORDER — MODAFINIL 200 MG PO TABS: 200.0000 mg | ORAL_TABLET | Freq: Two times a day (BID) | ORAL | Status: DC

## 2015-09-09 NOTE — Telephone Encounter (Signed)
Rx provided to last until appt.

## 2015-09-29 ENCOUNTER — Ambulatory Visit (INDEPENDENT_AMBULATORY_CARE_PROVIDER_SITE_OTHER): Payer: 59 | Admitting: Neurology

## 2015-09-29 ENCOUNTER — Encounter: Payer: Self-pay | Admitting: Neurology

## 2015-09-29 VITALS — BP 118/60 | HR 86 | Resp 20 | Ht 67.0 in | Wt 160.0 lb

## 2015-09-29 DIAGNOSIS — G47419 Narcolepsy without cataplexy: Secondary | ICD-10-CM

## 2015-09-29 MED ORDER — MODAFINIL 200 MG PO TABS: 200.0000 mg | ORAL_TABLET | Freq: Two times a day (BID) | ORAL | Status: DC

## 2015-09-29 NOTE — Progress Notes (Signed)
Grant [name]: Grant Grant [name] DOB: May 13, 1986  REASON FOR VISIT: follow up- narcolepsy HISTORY FROM: Grant [name]  HISTORY OF PRESENT ILLNESS: Grant Grant [name] is a  30 year old male with a history of narcolepsy. He returns today for follow-up. Grant Grant [name] is currently taking Nuvigil 200 mg in Grant morning and half a tablet in Grant afternoon. He reports that this has worked well for him in Grant past but now he is noticing that he is getting sleepy again. He states that he normally takes his first dose around 6:30 in Grant morning. He has to be in class at 8 AM.  He takes a second dose at 12 PM. He states for 5 PM in Grant afternoon he is very sleepy. He states that he does not consistently fall asleep in class but that has happened in Grant past. He states that if he does get sleepy he will get up and walk around to stay awake. He states that he feels Grant most tired in Grant afternoons. He does not operate a motor vehicle. He is currently not working -but going to school full-time.  His Epworth score today is 6  was previously 23 points. His MMSE was 30/30 . I will continue to  write for modafinil at bid .     HISTORY 11/04/13 [facility]): Grant Grant [name] is a 30 y.o. male Is seen here as a referral/ revisit from Dr. Willa Rough for followup on narcolepsy without cataplexy. Grant Grant [name] has been followed since 2008 and in March-19-2008 underwent a diagnostic polysomnogram which showed an apnea AHI of 0.0 Grant study was valid 1 and S. ability to follow Grant MSL T, based on an Epworth sleepiness score of 18 points, thus indicative of narcolepsy.  Grant Grant [name] has been treated with Provigil since , 200mg  milligram one tablet in Grant morning one half tablet right afternoon. Grant Grant [name] had no ccataplectic attacks in Grant past. That is last visit in 2013 this is his first visit on Grant Epic system.  Todays Epworth sleepiness score was endorsed at 5 points and his fatigue severity score of 27 points.  REVIEW OF SYSTEMS: Out  of a complete 14 system review of symptoms, Grant Grant [name] complains only of Grant following symptoms, and all other reviewed systems are negative.  Decreased concentration  ALLERGIES: No Known Allergies  HOME MEDICATIONS: Outpatient Prescriptions Prior to Visit  Medication Sig Dispense Refill  . ADVAIR DISKUS 250-50 MCG/DOSE AEPB INHALE 1 PUFF INTO Grant LUNGS 2 (TWO) TIMES DAILY. 60 each 6  . albuterol (PROAIR HFA) 108 (90 BASE) MCG/ACT inhaler Inhale 2 puffs into Grant lungs every 6 (six) hours as needed. 1 Inhaler 5  . modafinil (PROVIGIL) 200 MG tablet Take 1 tablet (200 mg total) by mouth 2 (two) times daily. Take 1 tablet by mouth in Grant morning and 1 tablet between 12 and 3 in Grant afternoon. 60 tablet 0  . travoprost, benzalkonium, (TRAVATAN) 0.004 % ophthalmic solution Place 1 drop into both eyes at bedtime.    Marland Kitchen doxycycline (VIBRAMYCIN) 100 MG capsule Take 1 capsule (100 mg total) by mouth 2 (two) times daily. 20 capsule 0  . modafinil (PROVIGIL) 200 MG tablet Take 1 tablet (200 mg total) by mouth 2 (two) times daily. TAKE 1 TABLET BY MOUTH EVERY MORNING AND AN ADDITIONAL 1/2 TABLET IN Grant AFTERNOON BETWEEN 12 AND 3P 180 tablet 1   No facility-administered medications prior to visit.    PAST MEDICAL HISTORY: Past Medical History  Diagnosis Date  . Asthma   .  Allergic rhinitis   . Narcolepsy   . Premature delivery     Born at 21 weeks of gestation  . Early stage glaucoma     Secondary to complications of prematurity    PAST SURGICAL HISTORY: Past Surgical History  Procedure Laterality Date  . Patent ductus arterious repair      FAMILY HISTORY: Family History  Problem Relation Age of Onset  . Allergies Other     siblings  . Asthma Other     siblings  . Seizures Father     SOCIAL HISTORY: Social History   Social History  . Marital Status: Single    Spouse Name: N/A  . Number of Children: 0  . Years of Education: College   Occupational History  . works part time  at [employer]   .     Social History Main Topics  . Smoking status: Never Smoker   . Smokeless tobacco: Never Used  . Alcohol Use: No  . Drug Use: No  . Sexual Activity: Not on file   Other Topics Concern  . Not on file   Social History Narrative   Grant [name] is single and lives with his parents.   Grant [name] does not drink any caffeine.   Grant [name] is right-handed.      PHYSICAL EXAM  Filed Vitals:   09/29/15 0827  BP: 118/60  Pulse: 86  Resp: 20  Height:  (1.702 m)  Weight: 160 lb (72.576 kg)   Body mass index is 25.05 kg/(m^2).  Generalized: Well developed, in no acute distress   Neurological examination  Mentation: Alert oriented to time, place, history taking. Follows all commands speech and language fluent. MMSE 30/30 Cranial nerve : no change in sense of taste and smell. : Pupils were equal round reactive to light. Extraocular movements were full, visual field were full on confrontational test. Facial sensation and strength were normal. Uvula tongue midline. Head turning and shoulder shrug  were normal and symmetric. Motor: Grant motor testing reveals 5 / 5 strength of all 4 extremities. Good symmetric motor tone is noted throughout.  Sensory: Sensory testing is intact to soft touch . No evidence of extinction is noted.  Coordination: Cerebellar testing reveals good finger-nose-finger and heel-to-shin bilaterally.  Gait and station: Gait / Tandem gait is intact  Reflexes: Deep tendon reflexes are symmetric and normal bilaterally.  Marland Kitchen   DIAGNOSTIC DATA (LABS, IMAGING, TESTING) - I reviewed Grant [name] records, labs, notes, testing and imaging myself where available.  ASSESSMENT AND PLAN;  20 minute visit.  More than 505 of Grant face to face time dedicated to Grant coordination of care, we discussed alternative medications in Grant treatment of narcolepsy without cataplexy., such as stimulants, SSRI and Xyrem. 30 y.o. year old male  has a past medical history of Asthma;  Allergic rhinitis; Narcolepsy; Premature delivery; and Early stage glaucoma. here with:  1. Narcolepsy 2. Fatigue 3.no cataplexy   Grant Grant [name]'s sleepiness and epworth  score has increased since Grant last visit.  We adjusted Grant times that he takes Grant Nuvigil. He will take Grant morning dose between 7 and 7:30 AM and his second dose around 2 PM he still experienced his sleepiness in Grant afternoons. Since his sleepiness does not improve we will have to consider another medication. He is not a candidate for XYREM.     Grant Grant [name] and his mom will work on nap times. These can modify his sleepiness. He is all day in school. His mother has  witnessed sleepiness in church on sundays.   We completed a MMSE today on Grant Grant [name] his score was 30/30. We will continue to monitor this. He will follow-up in May with Butch Penny, NP   Melvyn Novas, MD   09/29/2015, 8:48 AM [facility] [address] [place], [place] [address] [phone]

## 2015-10-18 ENCOUNTER — Telehealth: Payer: Self-pay

## 2015-10-18 NOTE — Telephone Encounter (Signed)
PA approved from [facility] for modafinil through 01/18/2016. [id number]

## 2015-12-27 ENCOUNTER — Ambulatory Visit: Payer: Medicare Other | Admitting: Adult Health

## 2015-12-27 ENCOUNTER — Telehealth: Payer: Self-pay | Admitting: Adult Health

## 2015-12-27 ENCOUNTER — Telehealth: Payer: Self-pay

## 2015-12-27 NOTE — Telephone Encounter (Signed)
Left message for patient in regard to his call left on our voice mail as he was unavailable.

## 2015-12-27 NOTE — Telephone Encounter (Signed)
Patient did not show to appt today  

## 2015-12-28 ENCOUNTER — Telehealth: Payer: Self-pay | Admitting: *Deleted

## 2015-12-28 ENCOUNTER — Encounter: Payer: Self-pay | Admitting: Adult Health

## 2015-12-28 NOTE — Telephone Encounter (Signed)
------------------------------------------------------------   Caller Richelle ItoMARTIN Turnipseed           CID [id number]  Patient SAME                 Pt's Dr Ethelene BrownsMILLIKAN     Area Code 336 Phone# [phone] * DOB [birth year]    RE WANTS TO RESCHEDULE APPOINTMENT-REFERRED TO       OFFICE                                               Disp:Y/N N If Y = C/B If No Response In 20minutes ============================================================

## 2016-01-11 ENCOUNTER — Ambulatory Visit (INDEPENDENT_AMBULATORY_CARE_PROVIDER_SITE_OTHER): Payer: 59 | Admitting: Adult Health

## 2016-01-11 ENCOUNTER — Encounter: Payer: Self-pay | Admitting: Adult Health

## 2016-01-11 VITALS — BP 114/72 | HR 78 | Resp 16 | Ht 67.0 in | Wt 154.0 lb

## 2016-01-11 DIAGNOSIS — G47419 Narcolepsy without cataplexy: Secondary | ICD-10-CM | POA: Diagnosis not present

## 2016-01-11 NOTE — Progress Notes (Signed)
PATIENT: Grant [name] DOB: 18-Feb-1986  REASON FOR VISIT: follow up- narcolepsy HISTORY FROM: patient  HISTORY OF PRESENT ILLNESS: Grant [name] is a 30 year old male with a history of narcolepsy. He returns today for follow-up. He continues to take Nuvigil 200 mg twice a day. He reports that this gives him moderate benefit. He states that he will still get sleepy during class but if he gets up and gets water this helps improve his sleepiness. The patient has not been taking naps throughout the day. He states that it is hard to take a nap when he is in school. Mom also states that he gets sleepy during church. Overall he's been doing well. Denies any new neurological symptoms. He returns today for an evaluation.  HISTORY 09/29/15 [facility]): Grant [name] is a 30 year old male with a history of narcolepsy. He returns today for follow-up. The patient is currently taking Nuvigil 200 mg in the morning and half a tablet in the afternoon. He reports that this has worked well for him in the past but now he is noticing that he is getting sleepy again. He states that he normally takes his first dose around 6:30 in the morning. He has to be in class at 8 AM.  He takes a second dose at 12 PM. He states for 5 PM in the afternoon he is very sleepy. He states that he does not consistently fall asleep in class but that has happened in the past. He states that if he does get sleepy he will get up and walk around to stay awake. He states that he feels the most tired in the afternoons. He does not operate a motor vehicle. He is currently not working -but going to school full-time.  His Epworth score today is 6 was previously 23 points. His MMSE was 30/30 . I will continue to write for modafinil at bid .     HISTORY 11/04/13 [facility]): Grant [name] is a 30 y.o. male Is seen here as a referral/ revisit from Dr. Willa RoughHicks for followup on narcolepsy without cataplexy. The patient has been followed  since 2008 and in March-19-2008 underwent a diagnostic polysomnogram which showed an apnea AHI of 0.0 the study was valid 1 and S. ability to follow the MSL T, based on an Epworth sleepiness score of 18 points, thus indicative of narcolepsy.  The patient has been treated with Provigil since , 200mg  milligram one tablet in the morning one half tablet right afternoon. The patient had no ccataplectic attacks in the past. That is last visit in 2013 this is his first visit on the Epic system.  Todays Epworth sleepiness score was endorsed at 5 points and his fatigue severity score of 27 points.  REVIEW OF SYSTEMS: Out of a complete 14 system review of symptoms, the patient complains only of the following symptoms, and all other reviewed systems are negative.  Fatigue, wheezing, runny nose, snoring  ALLERGIES: No Known Allergies  HOME MEDICATIONS: Outpatient Prescriptions Prior to Visit  Medication Sig Dispense Refill  . ADVAIR DISKUS 250-50 MCG/DOSE AEPB INHALE 1 PUFF INTO THE LUNGS 2 (TWO) TIMES DAILY. 60 each 6  . albuterol (PROAIR HFA) 108 (90 BASE) MCG/ACT inhaler Inhale 2 puffs into the lungs every 6 (six) hours as needed. 1 Inhaler 5  . modafinil (PROVIGIL) 200 MG tablet Take 1 tablet (200 mg total) by mouth 2 (two) times daily. Take 1 tablet by mouth in the morning and 1 tablet between 12 and 3 in  the afternoon. 180 tablet 1  . travoprost, benzalkonium, (TRAVATAN) 0.004 % ophthalmic solution Place 1 drop into both eyes at bedtime.     No facility-administered medications prior to visit.    PAST MEDICAL HISTORY: Past Medical History  Diagnosis Date  . Asthma   . Allergic rhinitis   . Narcolepsy   . Premature delivery     Born at 21 weeks of gestation  . Early stage glaucoma     Secondary to complications of prematurity    PAST SURGICAL HISTORY: Past Surgical History  Procedure Laterality Date  . Patent ductus arterious repair      FAMILY HISTORY: Family History    Problem Relation Age of Onset  . Allergies Other     siblings  . Asthma Other     siblings  . Seizures Father     SOCIAL HISTORY: Social History   Social History  . Marital Status: Single    Spouse Name: N/A  . Number of Children: 0  . Years of Education: College   Occupational History  . works part time at [employer]   .     Social History Main Topics  . Smoking status: Never Smoker   . Smokeless tobacco: Never Used  . Alcohol Use: No  . Drug Use: No  . Sexual Activity: Not on file   Other Topics Concern  . Not on file   Social History Narrative   Patient is single and lives with his parents.   Patient does not drink any caffeine.   Patient is right-handed.      PHYSICAL EXAM  Filed Vitals:   01/11/16 0941  BP: 114/72  Pulse: 78  Resp: 16  Height:  (1.702 m)  Weight: 154 lb (69.854 kg)   Body mass index is 24.11 kg/(m^2).  Generalized: Well developed, in no acute distress   Neurological examination  Mentation: Alert oriented to time, place, history taking. Follows all commands speech and language fluent Cranial nerve II-XII: Pupils were equal round reactive to light. Extraocular movements were full, visual field were full on confrontational test. Facial sensation and strength were normal. Uvula tongue midline. Head turning and shoulder shrug  were normal and symmetric. Motor: The motor testing reveals 5 over 5 strength of all 4 extremities. Good symmetric motor tone is noted throughout.  Sensory: Sensory testing is intact to soft touch on all 4 extremities. No evidence of extinction is noted.  Coordination: Cerebellar testing reveals good finger-nose-finger and heel-to-shin bilaterally.  Gait and station: Gait is normal. Reflexes: Deep tendon reflexes are symmetric and normal bilaterally.   DIAGNOSTIC DATA (LABS, IMAGING, TESTING) - I reviewed patient records, labs, notes, testing and imaging myself where available.   ASSESSMENT AND  PLAN 30 y.o. year old male  has a past medical history of Asthma; Allergic rhinitis; Narcolepsy; Premature delivery; and Early stage glaucoma. here with:  1. Narcolepsy  The patient will continue on Nuvigil 200 mg twice a day. I advised the patient that he can adjust the times that he takes his medication based on his need for that day. I did advise that he should not increase his dose. Patient and his mother verbalized understanding. Also encourage the patient to take naps throughout the day to help combat his sleepiness. He voiced understanding. He will return in 6 months with Dr. Vickey Huger.   Butch Penny, MSN, NP-C 01/11/2016, 9:50 AM [facility] [address], [address] [place], [place] [address] [phone]

## 2016-01-11 NOTE — Patient Instructions (Signed)
Continue modafinil- can adjust times you take it based on your need for that day. Take naps If your symptoms worsen or you develop new symptoms please let us know.

## 2016-01-11 NOTE — Progress Notes (Signed)
I agree with the assessment and plan as directed by NP .The patient is known to me . His next appointment will be with me.  Labs and XYREM caution included in patient education.     Magon Croson, MD

## 2016-01-30 ENCOUNTER — Telehealth: Payer: Self-pay | Admitting: Adult Health

## 2016-01-30 NOTE — Telephone Encounter (Signed)
Pt's wife called st[facility] RX for modafinil (PROVIGIL) 200 MG [facility] Pt has been out for over 1 week.

## 2016-01-30 NOTE — Telephone Encounter (Signed)
I called and spoke to pharmacy.  There is one refill (180tabs) that pt can get.  [facility] [place] (VM not set up or mail box full).

## 2016-01-31 NOTE — Telephone Encounter (Signed)
I spoke to mother and relayed that he has a refill available for provigil.  I had told the phamacist to get it ready for pick up.  I relayed this to her.  She will call prior to going to pick up.

## 2016-05-09 ENCOUNTER — Other Ambulatory Visit: Payer: Self-pay

## 2016-05-09 DIAGNOSIS — G47419 Narcolepsy without cataplexy: Secondary | ICD-10-CM

## 2016-05-09 NOTE — Telephone Encounter (Signed)
Refill request for Modafinil.

## 2016-05-10 MED ORDER — MODAFINIL 200 MG PO TABS: 200.0000 mg | ORAL_TABLET | Freq: Two times a day (BID) | ORAL | 1 refills | Status: DC

## 2016-05-10 NOTE — Addendum Note (Signed)
Addended by: Melvyn NovasHMEIER, Monya Kozakiewicz on: 05/10/2016 04:33 PM   Modules accepted: Orders

## 2016-05-10 NOTE — Telephone Encounter (Signed)
RX for [facility]. Received a receipt of confirmation.  

## 2016-06-22 ENCOUNTER — Ambulatory Visit (INDEPENDENT_AMBULATORY_CARE_PROVIDER_SITE_OTHER): Payer: 59 | Admitting: Emergency Medicine

## 2016-06-22 ENCOUNTER — Encounter: Payer: Self-pay | Admitting: Emergency Medicine

## 2016-06-22 DIAGNOSIS — J4540 Moderate persistent asthma, uncomplicated: Secondary | ICD-10-CM

## 2016-06-22 MED ORDER — ALBUTEROL SULFATE HFA 108 (90 BASE) MCG/ACT IN AERS: 2.0000 | INHALATION_SPRAY | Freq: Four times a day (QID) | RESPIRATORY_TRACT | 11 refills | Status: DC | PRN

## 2016-06-22 MED ORDER — FLUTICASONE-SALMETEROL 250-50 MCG/DOSE IN AEPB: INHALATION_SPRAY | RESPIRATORY_TRACT | 11 refills | Status: DC

## 2016-06-22 NOTE — Progress Notes (Signed)
Subjective:    Patient ID: Grant [name], male    DOB: 22-Sep-1985, 30 y.o.   MRN: [id number]  HPI 30 year old never smoker followed in our office previously by Dr. Shelle Ironlance for asthma, allergic rhinitis. Here today with his Mom. He had pulmonary function testing in 2009 that showed severe obstruction with an FEV1 of 50% predicted and no bronchodilator response. Currently managed on Advair 250/50 and albuterol as needed. He ran out of the Advair a while ago. He reports that he is using the albuterol approximately 1-3 x a week. He uses it with significant exercise like basketball.  He is having a lot of nasal gtt and congestion. No recent flares.    Review of Systems As per HPI  Past Medical History:  Diagnosis Date  . Allergic rhinitis   . Asthma   . 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 Narcolepsy   . Premature delivery    Born at 21 weeks of gestation     Family History  Problem Relation Age of Onset  . Allergies Other     siblings  . Asthma Other     siblings  . Seizures Father      Social History   Social History  . Marital status: Single    Spouse name: N/A  . Number of children: 0  . Years of education: College   Occupational History  . works part time at [employer]   .  [employer]    Social History Main Topics  . Smoking status: Never Smoker  . Smokeless tobacco: Never Used  . Alcohol use No  . Drug use: No  . Sexual activity: Not on file   Other Topics Concern  . Not on file   Social History Narrative   Patient is single and lives with his parents.   Patient does not drink any caffeine.   Patient is right-handed.     No Known Allergies   Outpatient Medications Prior to Visit  Medication Sig Dispense Refill  . ADVAIR DISKUS 250-50 MCG/DOSE AEPB INHALE 1 PUFF INTO THE LUNGS 2 (TWO) TIMES DAILY. 60 each 6  . albuterol (PROAIR HFA) 108 (90 BASE) MCG/ACT inhaler Inhale 2 puffs into the lungs every 6 (six)  hours as needed. 1 Inhaler 5  . modafinil (PROVIGIL) 200 MG tablet Take 1 tablet (200 mg total) by mouth 2 (two) times daily. Take 1 tablet by mouth in the morning and 1 tablet between 12 and 3 in the afternoon. 180 tablet 1  . travoprost, benzalkonium, (TRAVATAN) 0.004 % ophthalmic solution Place 1 drop into both eyes at bedtime.     No facility-administered medications prior to visit.          Objective:   Physical Exam Vitals:   06/22/16 1409 06/22/16 1410  BP:  110/70  Pulse:  60  SpO2:  100%  Weight: 152 lb (68.9 kg)   Height: 5\' 7"  (1.702 m)    Gen: Pleasant, well-nourished, in no distress, quiet affect  ENT: No lesions,  mouth clear,  oropharynx clear, no postnasal drip  Neck: No JVD, no TMG, no carotid bruits  Lungs: No use of accessory muscles, clear without rales or rhonchi  Cardiovascular: RRR, heart sounds normal, no murmur or gallops, no peripheral edema  Musculoskeletal: No deformities, no cyanosis or clubbing  Neuro: alert, non focal  Skin: Warm, no lesions or rashes       Assessment & Plan:  Moderate  persistent asthma in adult without complication Appears to be clinically stable. He does now state that he ran out of his Advair and hasn't been taking it. He is not really sure how long he's been out of it. He also isn't really sure whether he misses the medication although he does state that he is using pro-air with exertion, probably more frequently. I'd like to restart this and see how he improves.  We will restart Advair 250/50, 1 inhalation twice a day.  We will refill your ProAir, take 2 puffs up to every 4 hours if needed for shortness of breath.  Try staring loratadine 10mg  daily  Follow with Dr Delton CoombesByrum in 12 months or sooner if you have any problems  Levy Pupaobert Byrum, MD, PhD 06/22/2016, 2:32 PM [facility] [phone] or if no answer [phone]

## 2016-06-22 NOTE — Assessment & Plan Note (Signed)
Appears to be clinically stable. He does now state that he ran out of his Advair and hasn't been taking it. He is not really sure how long he's been out of it. He also isn't really sure whether he misses the medication although he does state that he is using pro-air with exertion, probably more frequently. I'd like to restart this and see how he improves.  We will restart Advair 250/50, 1 inhalation twice a day.  We will refill your ProAir, take 2 puffs up to every 4 hours if needed for shortness of breath.  Try staring loratadine 10mg  daily  Follow with Dr Delton CoombesByrum in 12 months or sooner if you have any problems

## 2016-06-22 NOTE — Patient Instructions (Signed)
We will restart Advair 250/50, 1 inhalation twice a day.  We will refill your ProAir, take 2 puffs up to every 4 hours if needed for shortness of breath.  Try staring loratadine 10mg  daily  Follow with Dr Delton CoombesByrum in 12 months or sooner if you have any problems

## 2016-07-16 ENCOUNTER — Telehealth: Payer: Self-pay

## 2016-07-16 ENCOUNTER — Ambulatory Visit: Payer: Medicare Other | Admitting: Neurology

## 2016-07-16 NOTE — Telephone Encounter (Signed)
Patient did not show to appt today  

## 2016-07-17 ENCOUNTER — Encounter: Payer: Self-pay | Admitting: Neurology

## 2016-12-11 ENCOUNTER — Other Ambulatory Visit: Payer: Self-pay

## 2016-12-11 MED ORDER — MODAFINIL 200 MG PO TABS: 200.0000 mg | ORAL_TABLET | Freq: Two times a day (BID) | ORAL | 0 refills | Status: DC

## 2016-12-11 NOTE — Telephone Encounter (Signed)
RX for [facility]. Received a receipt of confirmation.  

## 2017-03-19 ENCOUNTER — Ambulatory Visit (INDEPENDENT_AMBULATORY_CARE_PROVIDER_SITE_OTHER): Payer: 59 | Admitting: Neurology

## 2017-03-19 ENCOUNTER — Encounter: Payer: Self-pay | Admitting: Neurology

## 2017-03-19 ENCOUNTER — Telehealth: Payer: Self-pay | Admitting: Neurology

## 2017-03-19 VITALS — BP 124/82 | HR 56 | Ht 67.0 in | Wt 164.0 lb

## 2017-03-19 DIAGNOSIS — F71 Moderate intellectual disabilities: Secondary | ICD-10-CM

## 2017-03-19 DIAGNOSIS — G4719 Other hypersomnia: Secondary | ICD-10-CM | POA: Diagnosis not present

## 2017-03-19 MED ORDER — MODAFINIL 200 MG PO TABS: 200.0000 mg | ORAL_TABLET | Freq: Two times a day (BID) | ORAL | 5 refills | Status: DC

## 2017-03-19 NOTE — Progress Notes (Signed)
PATIENT: Grant [name] DOB: [birth year]  REASON FOR VISIT: follow up- narcolepsy HISTORY FROM: patient  HISTORY OF PRESENT ILLNESS:  Interval history from 03/19/2017, I have the pleasure of seeing Grant [name] today in the presence of his mother, last seen by Grant [name] nurse practitioner on 12/28/2015. Grant [name] is an established narcoleptic patient ( clinical diagnosis) , he is currently taking Nuvigil 200 mg in the morning and 100 mg or half a tablet in the afternoon if needed. He does feel sleepy or than he did a year ago. He feels that the afternoon hours are harder to stay awake in. He continues his classes beginning at 8 AM. Grant [name] ran out of medication, and his Epworth sleepiness score reflects this. His self evaluated Epworth score is 11 points. His mother objected, and endorsed 19 points. She also gave anecdotal evidence of his sleepiness. Grant [name] has fallen asleep during church services by being the [employer] at [employer]. He will continue to follow with Grant [name] for primary care, and he had blood work done at [facility].    Grant [name] is a  31 year old male with a history of narcolepsy. He returns today for follow-up. The patient is currently taking Nuvigil 200 mg in the morning and half a tablet in the afternoon. He reports that this has worked well for him in the past but now he is noticing that he is getting sleepy again. He states that he normally takes his first dose around 6:30 in the morning. He has to be in class at 8 AM. He takes a second dose at 12 PM. He states for 5 PM in the afternoon he is very sleepy. He states that he does not consistently fall asleep in class but that has happened in the past. He states that if he does get sleepy he will get up and walk around to stay awake. He states that he feels the most tired in the afternoons. He does not operate a motor vehicle. He is currently not working -but going to school full-time. His Epworth  score today is 6  was previously 23 points. His MMSE was 30/30 . I will continue to  write for modafinil at bid .    HISTORY 11/04/13 [facility]): Grant [name] is a 31 y.o. male Is seen here as a referral/ revisit from Grant [name] for followup on narcolepsy without cataplexy. The patient has been followed since 2008 and in March-19-2008 underwent a diagnostic polysomnogram which showed an apnea AHI of 0.0 the study was valid to follow with  the MSL T, based on an Epworth sleepiness score of 18 points, thusindicative of narcolepsy.  The patient has been treated with Provigil since , 200mg  milligram one tablet in the morning one half tablet right afternoon. The patient had no ccataplectic attacks in the past. That is last visit in 2013 this is his first visit on the Epic system.  Todays Epworth sleepiness score was endorsed at 5 points and his fatigue severity score of 27 points.  REVIEW OF SYSTEMS: Out of a complete 14 system review of symptoms, the patient complains only of the following symptoms, and all other reviewed systems are negative.  Decreased concentration, increasing sleepiness.  EPWORTH 19/24   ALLERGIES: No Known Allergies  HOME MEDICATIONS: Outpatient Medications Prior to Visit  Medication Sig Dispense Refill  . albuterol (PROAIR HFA) 108 (90 Base) MCG/ACT inhaler Inhale 2 puffs into the lungs every 6 (six) hours as needed. 1 Inhaler  11  . Fluticasone-Salmeterol (ADVAIR DISKUS) 250-50 MCG/DOSE AEPB INHALE 1 PUFF INTO THE LUNGS 2 (TWO) TIMES DAILY. 60 each 11  . modafinil (PROVIGIL) 200 MG tablet Take 1 tablet (200 mg total) by mouth 2 (two) times daily. Take 1 tablet by mouth in the morning and 1 tablet between 12 and 3 in the afternoon. 60 tablet 0  . travoprost, benzalkonium, (TRAVATAN) 0.004 % ophthalmic solution Place 1 drop into both eyes at bedtime.     No facility-administered medications prior to visit.     PAST MEDICAL HISTORY: Past Medical History:    Diagnosis Date  . Allergic rhinitis   . Asthma   . 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 Narcolepsy   . Premature delivery    Born at 21 weeks of gestation    PAST SURGICAL HISTORY: Past Surgical History:  Procedure Laterality Date  . PATENT DUCTUS ARTERIOUS REPAIR      FAMILY HISTORY: Family History  Problem Relation Age of Onset  . Allergies Other        siblings  . Asthma Other        siblings  . Seizures Father     SOCIAL HISTORY: Social History   Social History  . Marital status: Single    Spouse name: N/A  . Number of children: 0  . Years of education: College   Occupational History  . works part time at [employer]   .  [employer]    Social History Main Topics  . Smoking status: Never Smoker  . Smokeless tobacco: Never Used  . Alcohol use No  . Drug use: No  . Sexual activity: Not on file   Other Topics Concern  . Not on file   Social History Narrative   Patient is single and lives with his parents.   Patient does not drink any caffeine.   Patient is right-handed.      PHYSICAL EXAM  Vitals:   03/19/17 1205  BP: 124/82  Pulse: (!) 56  Weight: 164 lb (74.4 kg)  Height: 5\' 7"  (1.702 m)   Body mass index is 25.69 kg/m.  Generalized: Well developed, in no acute distress  Elongated facial skull.    Hoarseness, asthmatic, wheezing.   Neurological examination  Mentation: Alert oriented to time, place, history taking. Follows all commands speech and language fluent. MMSE 30/30 Cranial nerve : no change in sense of taste and smell. : taste and smell intact.   Pupils were equal round reactive to light. Extraocular movements were full, visual field were full on confrontational test. Facial sensation and strength were normal. Uvula tongue midline. Head turning and shoulder shrug  were normal and symmetric. Motor:  5 / 5 strength of all 4 extremities., symmetric motor tone is noted throughout.  Sensory:  Sensory testing is intact to soft touch . No evidence of extinction is noted.  Coordination: Cerebellar testing reveals good finger-nose-finger and heel-to-shin bilaterally.  Gait and station: Gait / Tandem gait is intact  Reflexes:  symmetric l bilaterally.  Marland Kitchen.   DIAGNOSTIC DATA (LABS, IMAGING, TESTING) - I reviewed patient records, labs, notes, testing and imaging myself where available.  Labs with Grant polite this summer.   ASSESSMENT AND PLAN;   20 minute visit.  More than 50%of the face to face time dedicated to the coordination of care,  Refilling modafinil.  He has been remakably well controlled for being not on medication (  he ran out )  according to his subjective score of 11 points Epworth- but his mother rescored him at 19 points. ,  we discussed alternative medications in the treatment of narcolepsy without any signs of  cataplexy, such as stimulants, SSRI and Xyrem.  31 y.o. year old male  has a past medical history of Allergic rhinitis; Asthma; Early stage glaucoma; Narcolepsy; and Premature delivery. here with:  1. Narcolepsy without cataplexy   The patient's sleepiness and epworth  score has increased since the last visit.  We completed a MMSE last year on the patient's MMSE  score was 30/30. We will  Not continue to monitor. Marland Kitchen   He will follow-up in May with Grant Penny, NP in 12 month    Melvyn Novas, MD   03/19/2017, 12:15 PM [facility] [address] [place], [place] [address] [phone]

## 2017-03-19 NOTE — Telephone Encounter (Signed)
Pt did show today but was present at 11:09 for apt. So MD was available to see.

## 2017-03-19 NOTE — Patient Instructions (Signed)
Your health :  Hypersomnia Hypersomnia is when you feel extremely tired during the day even though you're getting plenty of sleep at night. You may need to take naps during the day, and you may also be extremely difficult to wake up when you are sleeping. What are the causes? The cause of your hypersomnia may not be known. Hypersomnia may be caused by:  Medicines.  Sleep disorders, such as narcolepsy.  Trauma or injury to your head or nervous system.  Using drugs or alcohol.  Tumors.  Medical conditions, such as depression or hypothyroidism.  Genetics.  What are the signs or symptoms? The main symptoms of hypersomnia include:  Feeling extremely tired throughout the day.  Being very difficult to wake up.  Sleeping for longer and longer periods.  Taking naps throughout the day.  Other symptoms may include:  Feeling: ? Restless. ? Annoyed. ? Anxious. ? Low energy.  Having difficulty: ? Remembering. ? Speaking. ? Thinking.  Losing your appetite.  Experiencing hallucinations.  How is this diagnosed? Hypersomnia may be diagnosed by:  Medical history and physical exam. This will include a sleep history.  Completing sleep logs.  Tests may also be done, such as: ? Polysomnography. ? Multiple sleep latency test (MSLT).  How is this treated? There is no cure for hypersomnia, but treatment can be very effective in helping manage the condition. Treatment may include:  Lifestyle and sleeping strategies to help cope with the condition.  Stimulant medicines.  Treating any underlying causes of hypersomnia.  Follow these instructions at home:  Take medicines only as directed by your health care provider.  Schedule short naps for when you feel sleepiest during the day. Tell your employer or teachers that you have hypersomnia. You may be able to adjust your schedule to include time for naps.  Avoid drinking alcohol or caffeinated beverages.  Do not eat a heavy  meal before bedtime. Eat at about the same times every day.  Do not drive or operate heavy machinery if you are sleepy.  Do not swim or go out on the water without a life jacket.  If possible, adjust your schedule so that you do not have to work or be active at night.  Keep all follow-up visits as directed by your health care provider. This is important. Contact a health care provider if:  You have new symptoms.  Your symptoms get worse. Get help right away if: You have serious thoughts of hurting yourself or someone else. This information is not intended to replace advice given to you by your health care provider. Make sure you discuss any questions you have with your health care provider. Document Released: 07/20/2002 Document Revised: 01/05/2016 Document Reviewed: 03/04/2014 Elsevier Interactive Patient Education  2018 [employer].  YOU have a condition called narcolepsy: This means, that you have a sleep disorder that manifests with at times severe excessive sleepiness during the day and often with problems with sleep at night. We may have to try different medications that may help you stay awake during the day. Not everything works with everybody the same way. Wake promoting agents include stimulants and non-stimulant type medications. The most common side effects with stimulants are weight loss, insomnia, nervousness, headaches, palpitations, rise in blood pressure, anxiety. Stimulants can be addictive and subject to abuse. Non-stimulant type wake promoting medications include Provigil and Nuvigil, most common side effects include headaches, nervousness, insomnia, hypertension. In addition there is a medication called Xyrem which has been proven to be very  effective in patients with narcolepsy with or without cataplexy. Some patients with narcolepsy report episodes of weakness, such as jaw or facial weakness, legs giving out, feeling wobbly or like "Jell-o", etc. in situations of anxiety,  stress, laughter, sudden sadness, surprise, etc., which is called cataplexy. You can also experience episodes of sleep paralysis during which you may feel unable to move upon awakening. Some people experience dreamlike sequences upon awakening or upon drifting off to sleep, called hypnopompic or hypnagogic hallucinations.

## 2017-03-19 NOTE — Telephone Encounter (Signed)
PT no show at apt today.

## 2017-05-09 DIAGNOSIS — Z23 Encounter for immunization: Secondary | ICD-10-CM | POA: Diagnosis not present

## 2017-05-09 DIAGNOSIS — G47411 Narcolepsy with cataplexy: Secondary | ICD-10-CM | POA: Diagnosis not present

## 2017-05-09 DIAGNOSIS — F840 Autistic disorder: Secondary | ICD-10-CM | POA: Diagnosis not present

## 2017-05-09 DIAGNOSIS — J45909 Unspecified asthma, uncomplicated: Secondary | ICD-10-CM | POA: Diagnosis not present

## 2017-05-09 DIAGNOSIS — Z0000 Encounter for general adult medical examination without abnormal findings: Secondary | ICD-10-CM | POA: Diagnosis not present

## 2017-05-29 DIAGNOSIS — R748 Abnormal levels of other serum enzymes: Secondary | ICD-10-CM | POA: Diagnosis not present

## 2017-06-19 ENCOUNTER — Other Ambulatory Visit: Payer: Self-pay | Admitting: Neurology

## 2017-06-19 MED ORDER — MODAFINIL 200 MG PO TABS: 200.0000 mg | ORAL_TABLET | Freq: Two times a day (BID) | ORAL | 5 refills | Status: DC

## 2017-06-20 ENCOUNTER — Telehealth: Payer: Self-pay

## 2017-06-20 NOTE — Telephone Encounter (Signed)
Cover my meds key: [id number]

## 2017-06-20 NOTE — Telephone Encounter (Signed)
I received an prior auth request for modafinil. I have completed and submitted the PA and should have a determination within 48-72 hours.

## 2017-06-20 NOTE — Telephone Encounter (Signed)
[id number]. MODAFINIL TAB 200MG  is approved through 12/18/2017.

## 2017-06-25 ENCOUNTER — Ambulatory Visit: Payer: Medicare Other | Admitting: Emergency Medicine

## 2017-07-17 ENCOUNTER — Encounter: Payer: Self-pay | Admitting: Emergency Medicine

## 2017-07-17 ENCOUNTER — Ambulatory Visit (INDEPENDENT_AMBULATORY_CARE_PROVIDER_SITE_OTHER): Payer: Medicare Other | Admitting: Emergency Medicine

## 2017-07-17 DIAGNOSIS — J301 Allergic rhinitis due to pollen: Secondary | ICD-10-CM

## 2017-07-17 DIAGNOSIS — J4540 Moderate persistent asthma, uncomplicated: Secondary | ICD-10-CM | POA: Diagnosis not present

## 2017-07-17 NOTE — Assessment & Plan Note (Signed)
He needs his Nasonex refilled.  He does benefit from it.  We will refill it today and he will plan to use daily.

## 2017-07-17 NOTE — Patient Instructions (Signed)
Please continue Advair twice a day as you have been taking it Continue your Pro Air 2 puffs as needed for shortness of breath or in association with your exercise We will refill your Nasonex nasal spray, use 2 sprays each nostril once a day Flu shot up to day.  Follow with Dr Delton CoombesByrum in 12 months or sooner if you have any problems

## 2017-07-17 NOTE — Assessment & Plan Note (Signed)
Continue Advair and pro-air as he has been using them.  He needs refills of all of his medications we will arrange for this.  Flu shot is up-to-date.  I will see him annually or sooner if he has a problem.

## 2017-07-17 NOTE — Progress Notes (Signed)
Subjective:    Patient ID: Grant [name], male    DOB: Mar 17, 1986, 31 y.o.   MRN: [id number]  HPI [age] year old never smoker followed in our office previously by Dr. Shelle Ironlance for asthma, allergic rhinitis. Here today with his Mom. He had pulmonary function testing in 2009 that showed severe obstruction with an FEV1 of 50% predicted and no bronchodilator response. Currently managed on Advair 250/50 and albuterol as needed. He ran out of the Advair a while ago. He reports that he is using the albuterol approximately 1-3 x a week. He uses it with significant exercise like basketball.  He is having a lot of nasal gtt and congestion. No recent flares.   ROV 07/17/17 --this is a follow-up visit for moderate persistent asthma.  He also has a history of allergic rhinitis. He is doing well, has been reliable. No exacerbations, or hospitalizations. He does have some exercise related dyspnea, uses ProAir most days. He is able to play sports. Has some nasal congestion and needs his nasonex refilled.    Review of Systems As per HPI  Past Medical History:  Diagnosis Date  . Allergic rhinitis   . Asthma   . 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 Narcolepsy   . Premature delivery    Born at 21 weeks of gestation     Family History  Problem Relation Age of Onset  . Allergies Other        siblings  . Asthma Other        siblings  . Seizures Father      Social History   Socioeconomic History  . Marital status: Single    Spouse name: Not on file  . Number of children: 0  . Years of education: College  . Highest education level: Not on file  Social Needs  . Financial resource strain: Not on file  . Food insecurity - worry: Not on file  . Food insecurity - inability: Not on file  . Transportation needs - medical: Not on file  . Transportation needs - non-medical: Not on file  Occupational History  . Occupation: works part time at [employer]    Employer:  [employer]   Tobacco Use  . Smoking status: Never Smoker  . Smokeless tobacco: Never Used  Substance and Sexual Activity  . Alcohol use: No    Alcohol/week: 0.0 oz  . Drug use: No  . Sexual activity: Not on file  Other Topics Concern  . Not on file  Social History Narrative   Patient is single and lives with his parents.   Patient does not drink any caffeine.   Patient is right-handed.     No Known Allergies   Outpatient Medications Prior to Visit  Medication Sig Dispense Refill  . albuterol (PROAIR HFA) 108 (90 Base) MCG/ACT inhaler Inhale 2 puffs into the lungs every 6 (six) hours as needed. 1 Inhaler 11  . Fluticasone-Salmeterol (ADVAIR DISKUS) 250-50 MCG/DOSE AEPB INHALE 1 PUFF INTO THE LUNGS 2 (TWO) TIMES DAILY. 60 each 11  . modafinil (PROVIGIL) 200 MG tablet Take 1 tablet (200 mg total) 2 (two) times daily by mouth. Take 1 tablet by mouth in the morning and 1 tablet between 12 and 3 in the afternoon. 60 tablet 5  . travoprost, benzalkonium, (TRAVATAN) 0.004 % ophthalmic solution Place 1 drop into both eyes at bedtime.     No facility-administered medications prior to visit.  Objective:   Physical Exam Vitals:   07/17/17 1618  BP: 108/70  Pulse: 71  SpO2: 100%  Weight: 175 lb (79.4 kg)  Height: 5\' 7"  (1.702 m)   Gen: Pleasant, well-nourished, in no distress, quiet affect  ENT: No lesions,  mouth clear,  oropharynx clear, no postnasal drip  Neck: No JVD, no stridor  Lungs: No use of accessory muscles, clear without rales or rhonchi  Cardiovascular: RRR, heart sounds normal, no murmur or gallops, no peripheral edema  Musculoskeletal: No deformities, no cyanosis or clubbing  Neuro: alert, non focal  Skin: Warm, no lesions or rashes       Assessment & Plan:  Allergic rhinitis He needs his Nasonex refilled.  He does benefit from it.  We will refill it today and he will plan to use daily.  Moderate persistent asthma in adult without  complication Continue Advair and pro-air as he has been using them.  He needs refills of all of his medications we will arrange for this.  Flu shot is up-to-date.  I will see him annually or sooner if he has a problem.  Levy Pupaobert Holle Sprick, MD, PhD 07/17/2017, 4:41 PM [facility] [phone] or if no answer [phone]

## 2017-08-01 ENCOUNTER — Other Ambulatory Visit: Payer: Self-pay | Admitting: Emergency Medicine

## 2017-08-12 ENCOUNTER — Other Ambulatory Visit: Payer: Self-pay | Admitting: *Deleted

## 2017-08-12 MED ORDER — FLUTICASONE-SALMETEROL 250-50 MCG/DOSE IN AEPB: INHALATION_SPRAY | RESPIRATORY_TRACT | 3 refills | Status: DC

## 2017-09-18 ENCOUNTER — Ambulatory Visit (INDEPENDENT_AMBULATORY_CARE_PROVIDER_SITE_OTHER): Payer: Managed Care, Other (non HMO) | Admitting: Adult Health

## 2017-09-18 ENCOUNTER — Encounter: Payer: Self-pay | Admitting: Adult Health

## 2017-09-18 VITALS — BP 115/70 | HR 81 | Ht 67.0 in | Wt 175.4 lb

## 2017-09-18 DIAGNOSIS — G47419 Narcolepsy without cataplexy: Secondary | ICD-10-CM

## 2017-09-18 MED ORDER — MODAFINIL 200 MG PO TABS: 200.0000 mg | ORAL_TABLET | Freq: Two times a day (BID) | ORAL | 5 refills | Status: DC

## 2017-09-18 NOTE — Progress Notes (Signed)
PATIENT: XAIDEN FLEIG DOB: 1986/08/04  REASON FOR VISIT: follow up HISTORY FROM: patient  HISTORY OF PRESENT ILLNESS: Today 09/18/17 Mr. Dirico is a 32 year old male with a history of narcolepsy.  He returns today for follow-up.  He remains on Nuvigil 200 mg in the morning and an additional 200 mg in the evening.  He reports that this works well for him.  He denies any significant daytime sleepiness.  Denies falling asleep at work.  He states he just recently got a new job at [employer].  He denies any new neurological symptoms.  He returns today for an evaluation.  HISTORY Mr. Anita Mcadory today in the presence of his mother, last seen by Butch Penny nurse practitioner on 12/28/2015. Mr. Bonano is an established narcoleptic patient ( clinical diagnosis) , he is currently taking Nuvigil 200 mg in the morning and 100 mg or half a tablet in the afternoon if needed. He does feel sleepy or than he did a year ago. He feels that the afternoon hours are harder to stay awake in. He continues his classes beginning at 8 AM. Mr. Puebla ran out of medication, and his Epworth sleepiness score reflects this. His self evaluated Epworth score is 11 points. His mother objected, and endorsed 19 points. She also gave anecdotal evidence of his sleepiness. Gaige has fallen asleep during church services by being the [employer] at [employer]. He will continue to follow with Dr Nehemiah Settle for primary care, and he had blood work done at [facility].     REVIEW OF SYSTEMS: Out of a complete 14 system review of symptoms, the patient complains only of the following symptoms, and all other reviewed systems are negative.  See HPI  ALLERGIES: No Known Allergies  HOME MEDICATIONS: Outpatient Medications Prior to Visit  Medication Sig Dispense Refill  . albuterol (PROAIR HFA) 108 (90 Base) MCG/ACT inhaler Inhale 2 puffs into the lungs every 6 (six) hours as needed. 1 Inhaler 11  .  Fluticasone-Salmeterol (ADVAIR DISKUS) 250-50 MCG/DOSE AEPB INHALE 1 PUFF INTO THE LUNGS 2 (TWO) TIMES DAILY. 180 each 3  . modafinil (PROVIGIL) 200 MG tablet Take 1 tablet (200 mg total) 2 (two) times daily by mouth. Take 1 tablet by mouth in the morning and 1 tablet between 12 and 3 in the afternoon. 60 tablet 5  . travoprost, benzalkonium, (TRAVATAN) 0.004 % ophthalmic solution Place 1 drop into both eyes at bedtime.     No facility-administered medications prior to visit.     PAST MEDICAL HISTORY: Past Medical History:  Diagnosis Date  . Allergic rhinitis   . Asthma   . 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 Narcolepsy   . Premature delivery    Born at 21 weeks of gestation    PAST SURGICAL HISTORY: Past Surgical History:  Procedure Laterality Date  . PATENT DUCTUS ARTERIOUS REPAIR      FAMILY HISTORY: Family History  Problem Relation Age of Onset  . Allergies Other        siblings  . Asthma Other        siblings  . Seizures Father     SOCIAL HISTORY: Social History   Socioeconomic History  . Marital status: Single    Spouse name: Not on file  . Number of children: 0  . Years of education: College  . Highest education level: Not on file  Social Needs  . Financial resource strain: Not on file  .  Food insecurity - worry: Not on file  . Food insecurity - inability: Not on file  . Transportation needs - medical: Not on file  . Transportation needs - non-medical: Not on file  Occupational History  . Occupation: works part time at [employer]    Employer: [employer]   Tobacco Use  . Smoking status: Never Smoker  . Smokeless tobacco: Never Used  Substance and Sexual Activity  . Alcohol use: No    Alcohol/week: 0.0 oz  . Drug use: No  . Sexual activity: Not on file  Other Topics Concern  . Not on file  Social History Narrative   Patient is single and lives with his parents.   Patient does not drink any caffeine.    Patient is right-handed.      PHYSICAL EXAM  Vitals:   09/18/17 1413  BP: 115/70  Pulse: 81  Weight: 175 lb 6.4 oz (79.6 kg)  Height: 5\' 7"  (1.702 m)   Body mass index is 27.47 kg/m.  Generalized: Well developed, in no acute distress   Neurological examination  Mentation: Alert oriented to time, place, history taking. Follows all commands speech and language fluent Cranial nerve II-XII: Pupils were equal round reactive to light. Extraocular movements were full, visual field were full on confrontational test. Facial sensation and strength were normal. Uvula tongue midline. Head turning and shoulder shrug  were normal and symmetric. Motor: The motor testing reveals 5 over 5 strength of all 4 extremities. Good symmetric motor tone is noted throughout.  Sensory: Sensory testing is intact to soft touch on all 4 extremities. No evidence of extinction is noted.  Coordination: Cerebellar testing reveals good finger-nose-finger and heel-to-shin bilaterally.  Gait and station: Gait is normal. Tandem gait is normal. Romberg is negative. No drift is seen.  Reflexes: Deep tendon reflexes are symmetric and normal bilaterally.   DIAGNOSTIC DATA (LABS, IMAGING, TESTING) - I reviewed patient records, labs, notes, testing and imaging myself where available.     ASSESSMENT AND PLAN 32 y.o. year old male  has a past medical history of Allergic rhinitis, Asthma, Early stage glaucoma, Narcolepsy, and Premature delivery. here with :  1.  Narcolepsy  Overall the patient is doing well.  He will remain on nuvigil.  Advised that if his symptoms worsen or he develops new symptoms he should let us know.  He will follow-up in 1 year or sooner if needed.  I spent 15 minutes with the patient. 50% of this time was spent discussing his medication    Butch PennyMegan Uriyah Raska, MSN, NP-C 09/18/2017, 2:22 PM [facility] [address], [address] [place], [place] [address] [phone]

## 2017-09-18 NOTE — Patient Instructions (Signed)
Your Plan:  Continue Nuvigil  If your symptoms worsen or you develop new symptoms please let us know.   Thank you for coming to see us at [facility]. I hope we have been able to provide you high quality care today.  You may receive a patient satisfaction survey over the next few weeks. We would appreciate your feedback and comments so that we may continue to improve ourselves and the health of our patients.  

## 2017-09-18 NOTE — Progress Notes (Signed)
Fax confirmation received for modafinil [facility] [phone].   Also pt brought in SCAT application, filled out completed and signed.  Given original to pt.

## 2017-09-19 NOTE — Progress Notes (Signed)
I agree with the assessment and plan as directed by NP .The patient is known to me .   Deyci Gesell, MD  

## 2017-09-26 ENCOUNTER — Telehealth: Payer: Self-pay

## 2017-09-26 NOTE — Telephone Encounter (Signed)
We received a prior authorization request for this medication. I have completed and submitted the PA on Cover My Meds and should have a determination within 48-72 hours.  Cover My Meds Key: [id number]

## 2017-09-26 NOTE — Telephone Encounter (Signed)
PA approved for the patient from 08/11/17-08/12/2018. REF# [id number]

## 2018-01-10 ENCOUNTER — Other Ambulatory Visit: Payer: Self-pay | Admitting: Neurology

## 2018-01-13 ENCOUNTER — Telehealth: Payer: Self-pay | Admitting: Neurology

## 2018-01-13 NOTE — Telephone Encounter (Signed)
PA completed for the pt through [facility] appears someone may have started it on 01/10/2018. I have completed the questionare and send it in today. PA came back approved through [facility] until 08/12/2018 under his medicare part D benefit.

## 2018-01-20 MED ORDER — MODAFINIL 200 MG PO TABS: ORAL_TABLET | ORAL | 1 refills | Status: DC

## 2018-01-20 NOTE — Telephone Encounter (Signed)
Grant [name] with [facility] requesting refill for modafinil (PROVIGIL) 200 MG tablet.

## 2018-01-20 NOTE — Telephone Encounter (Signed)
I [facility] and [facility]ist for [facility] and confirmed prescription for the modafinil 200mg  tabs, taking 1 tablet in AM and another between 12 and 3pm in the afternoon. #60 with 5 refills.  This was faxed to them 01-13-18.  She will fill.

## 2018-01-20 NOTE — Addendum Note (Signed)
Addended by: Guy BeginYOUNG, SANDRA S on: 01/20/2018 03:41 PM   Modules accepted: Orders

## 2018-01-20 NOTE — Telephone Encounter (Signed)
Spoke to [facility] Pharmacist at [facility] and relayed that would like to change to 180# and 1 refill.  She took this information and in process for working on the 30day supply and this may go out and if so will readjust next fill.

## 2018-01-20 NOTE — Telephone Encounter (Signed)
Ok to for #90 with 1 refill.

## 2018-01-20 NOTE — Telephone Encounter (Signed)
I called and spoke to pt.  Mother is not on DPR.  I relayed that I did call back and get 90 day supply for pt, depending on the process the 30 day will go out or could be 90day, but know that 90 day supply was verbally called into [facility],  Next visit to have mother and other family members placed on Hippa/ DPR.  He verbalized understanding.

## 2018-01-20 NOTE — Telephone Encounter (Signed)
Patient mother calling stating she is not happy with the way Rx for Modafinil was handled and needs to discuss.

## 2018-05-05 ENCOUNTER — Other Ambulatory Visit: Payer: Self-pay | Admitting: Neurology

## 2018-08-11 DIAGNOSIS — Z0271 Encounter for disability determination: Secondary | ICD-10-CM

## 2018-09-18 ENCOUNTER — Ambulatory Visit: Payer: Medicare Other | Admitting: Adult Health

## 2018-10-19 NOTE — Progress Notes (Deleted)
PATIENT: Grant [name] DOB: [birth year]  REASON FOR VISIT: follow up HISTORY FROM: patient  HISTORY OF PRESENT ILLNESS: Today 10/20/18  HISTORY  Grant [name] is a 33 year old male with a history of narcolepsy.  He returns today for follow-up.  He remains on Nuvigil 200 mg in the morning and an additional 200 mg in the evening.  He reports that this works well for him.  He denies any significant daytime sleepiness. Denies falling asleep at work.  He states he just recently got a new job at [employer].  He denies any new neurological symptoms.  He returns today for an evaluation.   REVIEW OF SYSTEMS: Out of a complete 14 system review of symptoms, the patient complains only of the following symptoms, and all other reviewed systems are negative.  ALLERGIES: No Known Allergies  HOME MEDICATIONS: Outpatient Medications Prior to Visit  Medication Sig Dispense Refill  . albuterol (PROAIR HFA) 108 (90 Base) MCG/ACT inhaler Inhale 2 puffs into the lungs every 6 (six) hours as needed. 1 Inhaler 11  . Fluticasone-Salmeterol (ADVAIR DISKUS) 250-50 MCG/DOSE AEPB INHALE 1 PUFF INTO THE LUNGS 2 (TWO) TIMES DAILY. 180 each 3  . modafinil (PROVIGIL) 200 MG tablet TAKE 1 TABLET BY MOUTH IN  THE MORNING AND 1 TABLET  BETWEEN 12PM AND 3PM IN THE AFTERNOON 180 tablet 0  . travoprost, benzalkonium, (TRAVATAN) 0.004 % ophthalmic solution Place 1 drop into both eyes at bedtime.     No facility-administered medications prior to visit.     PAST MEDICAL HISTORY: Past Medical History:  Diagnosis Date  . Allergic rhinitis   . Asthma   . 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 Narcolepsy   . Premature delivery    Born at 21 weeks of gestation    PAST SURGICAL HISTORY: Past Surgical History:  Procedure Laterality Date  . PATENT DUCTUS ARTERIOUS REPAIR      FAMILY HISTORY: Family History  Problem Relation Age of Onset  . Allergies Other        siblings    . Asthma Other        siblings  . Seizures Father     SOCIAL HISTORY: Social History   Socioeconomic History  . Marital status: Single    Spouse name: Not on file  . Number of children: 0  . Years of education: College  . Highest education level: Not on file  Occupational History  . Occupation: works part time at [employer]: [employer]   Social Needs  . Financial resource strain: Not on file  . Food insecurity:    Worry: Not on file    Inability: Not on file  . Transportation needs:    Medical: Not on file    Non-medical: Not on file  Tobacco Use  . Smoking status: Never Smoker  . Smokeless tobacco: Never Used  Substance and Sexual Activity  . Alcohol use: No    Alcohol/week: 0.0 standard drinks  . Drug use: No  . Sexual activity: Not on file  Lifestyle  . Physical activity:    Days per week: Not on file    Minutes per session: Not on file  . Stress: Not on file  Relationships  . Social connections:    Talks on phone: Not on file    Gets together: Not on file    Attends religious service: Not on file    Active member of club  or organization: Not on file    Attends meetings of clubs or organizations: Not on file    Relationship status: Not on file  . Intimate partner violence:    Fear of current or ex partner: Not on file    Emotionally abused: Not on file    Physically abused: Not on file    Forced sexual activity: Not on file  Other Topics Concern  . Not on file  Social History Narrative   Patient is single and lives with his parents.   Patient does not drink any caffeine.   Patient is right-handed.      PHYSICAL EXAM  There were no vitals filed for this visit. There is no height or weight on file to calculate BMI.  Generalized: Well developed, in no acute distress   Neurological examination  Mentation: Alert oriented to time, place, history taking. Follows all commands speech and language fluent Cranial nerve II-XII: Pupils  were equal round reactive to light. Extraocular movements were full, visual field were full on confrontational test. Facial sensation and strength were normal. Uvula tongue midline. Head turning and shoulder shrug  were normal and symmetric. Motor: The motor testing reveals 5 over 5 strength of all 4 extremities. Good symmetric motor tone is noted throughout.  Sensory: Sensory testing is intact to soft touch on all 4 extremities. No evidence of extinction is noted.  Coordination: Cerebellar testing reveals good finger-nose-finger and heel-to-shin bilaterally.  Gait and station: Gait is normal. Tandem gait is normal. Romberg is negative. No drift is seen.  Reflexes: Deep tendon reflexes are symmetric and normal bilaterally.   DIAGNOSTIC DATA (LABS, IMAGING, TESTING) - I reviewed patient records, labs, notes, testing and imaging myself where available.  Lab Results  Component Value Date   HGB 16.0 02/09/2010   No results found for: NA, K, CL, CO2, GLUCOSE, BUN, CREATININE, CALCIUM, PROT, ALBUMIN, AST, ALT, ALKPHOS, BILITOT, GFRNONAA, GFRAA No results found for: CHOL, HDL, LDLCALC, LDLDIRECT, TRIG, CHOLHDL No results found for: HKUV7D No results found for: VITAMINB12 No results found for: TSH    ASSESSMENT AND PLAN 33 y.o. year old male  has a past medical history of Allergic rhinitis, Asthma, Early stage glaucoma, Narcolepsy, and Premature delivery. here with ***   I spent 15 minutes with the patient. 50% of this time was spent   Margie Ege, [place], DNP 10/20/2018, 7:50 AM [facility] [address], [address] [place], [place] [address] [phone]

## 2018-10-20 ENCOUNTER — Encounter: Payer: Self-pay | Admitting: Neurology

## 2018-10-20 ENCOUNTER — Telehealth: Payer: Self-pay

## 2018-10-20 ENCOUNTER — Ambulatory Visit: Payer: Managed Care, Other (non HMO) | Admitting: Neurology

## 2018-10-20 NOTE — Telephone Encounter (Signed)
Patient was a no call/no show for their appointment today.   

## 2018-10-24 NOTE — Progress Notes (Deleted)
PATIENT: Grant [name] DOB: Sep 28, 1985  REASON FOR VISIT: follow up HISTORY FROM: patient  HISTORY OF PRESENT ILLNESS: Today 10/24/18  HISTORY 09/18/2017 Grant [name] is a 33 year old male with a history of narcolepsy.  He returns today for follow-up.  He remains on Nuvigil 200 mg in the morning and an additional 200 mg in the evening.  He reports that this works well for him.  He denies any significant daytime sleepiness.  Denies falling asleep at work.  He states he just recently got a new job at [employer].  He denies any new neurological symptoms.  He returns today for an evaluation  REVIEW OF SYSTEMS: Out of a complete 14 system review of symptoms, the patient complains only of the following symptoms, and all other reviewed systems are negative.  ALLERGIES: No Known Allergies  HOME MEDICATIONS: Outpatient Medications Prior to Visit  Medication Sig Dispense Refill  . albuterol (PROAIR HFA) 108 (90 Base) MCG/ACT inhaler Inhale 2 puffs into the lungs every 6 (six) hours as needed. 1 Inhaler 11  . Fluticasone-Salmeterol (ADVAIR DISKUS) 250-50 MCG/DOSE AEPB INHALE 1 PUFF INTO THE LUNGS 2 (TWO) TIMES DAILY. 180 each 3  . modafinil (PROVIGIL) 200 MG tablet TAKE 1 TABLET BY MOUTH IN  THE MORNING AND 1 TABLET  BETWEEN 12PM AND 3PM IN THE AFTERNOON 180 tablet 0  . travoprost, benzalkonium, (TRAVATAN) 0.004 % ophthalmic solution Place 1 drop into both eyes at bedtime.     No facility-administered medications prior to visit.     PAST MEDICAL HISTORY: Past Medical History:  Diagnosis Date  . Allergic rhinitis   . Asthma   . 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 Narcolepsy   . Premature delivery    Born at 21 weeks of gestation    PAST SURGICAL HISTORY: Past Surgical History:  Procedure Laterality Date  . PATENT DUCTUS ARTERIOUS REPAIR      FAMILY HISTORY: Family History  Problem Relation Age of Onset  . Allergies Other    siblings  . Asthma Other        siblings  . Seizures Father     SOCIAL HISTORY: Social History   Socioeconomic History  . Marital status: Single    Spouse name: Not on file  . Number of children: 0  . Years of education: College  . Highest education level: Not on file  Occupational History  . Occupation: works part time at [employer]: [employer]   Social Needs  . Financial resource strain: Not on file  . Food insecurity:    Worry: Not on file    Inability: Not on file  . Transportation needs:    Medical: Not on file    Non-medical: Not on file  Tobacco Use  . Smoking status: Never Smoker  . Smokeless tobacco: Never Used  Substance and Sexual Activity  . Alcohol use: No    Alcohol/week: 0.0 standard drinks  . Drug use: No  . Sexual activity: Not on file  Lifestyle  . Physical activity:    Days per week: Not on file    Minutes per session: Not on file  . Stress: Not on file  Relationships  . Social connections:    Talks on phone: Not on file    Gets together: Not on file    Attends religious service: Not on file    Active member of club or organization: Not on file  Attends meetings of clubs or organizations: Not on file    Relationship status: Not on file  . Intimate partner violence:    Fear of current or ex partner: Not on file    Emotionally abused: Not on file    Physically abused: Not on file    Forced sexual activity: Not on file  Other Topics Concern  . Not on file  Social History Narrative   Patient is single and lives with his parents.   Patient does not drink any caffeine.   Patient is right-handed.      PHYSICAL EXAM  There were no vitals filed for this visit. There is no height or weight on file to calculate BMI.  Generalized: Well developed, in no acute distress   Neurological examination  Mentation: Alert oriented to time, place, history taking. Follows all commands speech and language fluent Cranial nerve  II-XII: Pupils were equal round reactive to light. Extraocular movements were full, visual field were full on confrontational test. Facial sensation and strength were normal. Uvula tongue midline. Head turning and shoulder shrug  were normal and symmetric. Motor: The motor testing reveals 5 over 5 strength of all 4 extremities. Good symmetric motor tone is noted throughout.  Sensory: Sensory testing is intact to soft touch on all 4 extremities. No evidence of extinction is noted.  Coordination: Cerebellar testing reveals good finger-nose-finger and heel-to-shin bilaterally.  Gait and station: Gait is normal. Tandem gait is normal. Romberg is negative. No drift is seen.  Reflexes: Deep tendon reflexes are symmetric and normal bilaterally.   DIAGNOSTIC DATA (LABS, IMAGING, TESTING) - I reviewed patient records, labs, notes, testing and imaging myself where available.  Lab Results  Component Value Date   HGB 16.0 02/09/2010   No results found for: NA, K, CL, CO2, GLUCOSE, BUN, CREATININE, CALCIUM, PROT, ALBUMIN, AST, ALT, ALKPHOS, BILITOT, GFRNONAA, GFRAA No results found for: CHOL, HDL, LDLCALC, LDLDIRECT, TRIG, CHOLHDL No results found for: OFHQ1F No results found for: VITAMINB12 No results found for: TSH    ASSESSMENT AND PLAN 33 y.o. year old male  has a past medical history of Allergic rhinitis, Asthma, Early stage glaucoma, Narcolepsy, and Premature delivery. here with ***   I spent 15 minutes with the patient. 50% of this time was spent   Margie Ege, [place], DNP 10/24/2018, 11:12 AM [facility] [address] [place], [place] [address] [phone]

## 2018-10-26 ENCOUNTER — Telehealth: Payer: Self-pay | Admitting: Neurology

## 2018-10-26 NOTE — Telephone Encounter (Signed)
I called the patient and let him know that his appointment for March 16th has been cancelled. Our office will be reaching out to reschedule. He verbalized understanding.

## 2018-10-27 ENCOUNTER — Ambulatory Visit: Payer: Managed Care, Other (non HMO) | Admitting: Neurology

## 2018-11-03 ENCOUNTER — Other Ambulatory Visit: Payer: Self-pay | Admitting: Emergency Medicine

## 2018-11-13 ENCOUNTER — Encounter: Payer: Self-pay | Admitting: Neurology

## 2018-11-13 ENCOUNTER — Ambulatory Visit (INDEPENDENT_AMBULATORY_CARE_PROVIDER_SITE_OTHER): Payer: Managed Care, Other (non HMO) | Admitting: Neurology

## 2018-11-13 ENCOUNTER — Other Ambulatory Visit: Payer: Self-pay | Admitting: Neurology

## 2018-11-13 ENCOUNTER — Ambulatory Visit: Payer: Self-pay | Admitting: Neurology

## 2018-11-13 ENCOUNTER — Other Ambulatory Visit: Payer: Self-pay

## 2018-11-13 DIAGNOSIS — G47419 Narcolepsy without cataplexy: Secondary | ICD-10-CM

## 2018-11-13 MED ORDER — MODAFINIL 200 MG PO TABS: ORAL_TABLET | ORAL | 5 refills | Status: DC

## 2018-11-13 NOTE — Progress Notes (Signed)
Virtual Visit via Video Note  I connected with Grant [name] on 11/13/18 at  8:45 AM EDT by a video enabled telemedicine application and verified that I am speaking with the correct person using two identifiers.   I discussed the limitations of evaluation and management by telemedicine and the availability of in person appointments. The patient expressed understanding and agreed to proceed.  History of Present Illness:  11/13/2018 SS: Grant [name] is a 33 year old male with history of narcolepsy.  He presented via video visit for a follow-up visit.  He remains on Provigil 200 mg in the morning, 200 mg around noon or early afternoon.  He reports the medication is beneficial. He denies any significant daytime sleepiness.  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  He reports sleepiness does not impact his job.  He reports he will go to bed around 9:00 pm and sleep for about 8 to 9 hours.  He currently lives with his family.  He does not drive a car, takes the bus for transportation.  He does see his primary care doctor yearly for physical, blood work.  He reports he has gotten a good report.  He denies any new problems or concerns.  He reports he is tolerating the medication well.  He denies any changes to his medical history or medications.  He reports he does manage his own medications.  09/18/2017 MM: Grant [name] is a 33 year old male with a history of narcolepsy.  He returns today for follow-up.  He remains on Nuvigil 200 mg in the morning and an additional 200 mg in the evening.  He reports that this works well for him.  He denies any significant daytime sleepiness.  Denies falling asleep at work.  He states he just recently got a new job at [employer].  He denies any new neurological symptoms.  He returns today for an evaluation.   Observations/Objective: Alert, speech muffled, is fluent, appropriate response, follows commands, equal facial symmetry, symmetric  shoulder shrug, no arm drift, able to perform finger-to-nose outstretched, gait is intact  Assessment and Plan: 1.  Narcolepsy  He will continue taking Provigil 200 mg twice daily.  The medication has been very beneficial for him.  He is tolerating medication well.  His Epworth sleepiness scale was 9.  He will follow-up in 1 year.  I advised him that if his symptoms worsen or if he develops any new symptoms he should let us know.  Follow Up Instructions:  1 year for revisit     I discussed the assessment and treatment plan with the patient. The patient was provided an opportunity to ask questions and all were answered. The patient agreed with the plan and demonstrated an understanding of the instructions.   The patient was advised to call back or seek an in-person evaluation if the symptoms worsen or if the condition fails to improve as anticipated.  I provided 20 minutes of non-face-to-face time during this encounter.  Otila Kluver, DNP  [facility] [address], [address] [place], [place] [address] [phone]

## 2018-12-24 ENCOUNTER — Ambulatory Visit
Admission: EM | Admit: 2018-12-24 | Discharge: 2018-12-24 | Disposition: A | Discharge status: Home / Self Care | Payer: 59 | Source: Home / Self Care

## 2018-12-24 ENCOUNTER — Emergency Department (HOSPITAL_COMMUNITY): Payer: 59

## 2018-12-24 ENCOUNTER — Other Ambulatory Visit: Payer: Self-pay

## 2018-12-24 ENCOUNTER — Inpatient Hospital Stay (HOSPITAL_COMMUNITY)
Admission: EM | Admit: 2018-12-24 | Discharge: 2018-12-27 | DRG: 638 | Disposition: A | Discharge status: Home / Self Care | Payer: 59 | Attending: Internal Medicine | Admitting: Internal Medicine

## 2018-12-24 ENCOUNTER — Encounter (HOSPITAL_COMMUNITY): Payer: Self-pay

## 2018-12-24 DIAGNOSIS — R5383 Other fatigue: Secondary | ICD-10-CM

## 2018-12-24 DIAGNOSIS — Z7951 Long term (current) use of inhaled steroids: Secondary | ICD-10-CM

## 2018-12-24 DIAGNOSIS — F71 Moderate intellectual disabilities: Secondary | ICD-10-CM | POA: Diagnosis present

## 2018-12-24 DIAGNOSIS — Z880 Allergy status to penicillin: Secondary | ICD-10-CM | POA: Diagnosis not present

## 2018-12-24 DIAGNOSIS — E1110 Type 2 diabetes mellitus with ketoacidosis without coma: Secondary | ICD-10-CM | POA: Diagnosis not present

## 2018-12-24 DIAGNOSIS — R000 Tachycardia, unspecified: Secondary | ICD-10-CM

## 2018-12-24 DIAGNOSIS — R066 Hiccough: Secondary | ICD-10-CM | POA: Diagnosis present

## 2018-12-24 DIAGNOSIS — J4540 Moderate persistent asthma, uncomplicated: Secondary | ICD-10-CM | POA: Diagnosis not present

## 2018-12-24 DIAGNOSIS — E860 Dehydration: Secondary | ICD-10-CM | POA: Diagnosis present

## 2018-12-24 DIAGNOSIS — Z833 Family history of diabetes mellitus: Secondary | ICD-10-CM

## 2018-12-24 DIAGNOSIS — E1310 Other specified diabetes mellitus with ketoacidosis without coma: Secondary | ICD-10-CM | POA: Diagnosis not present

## 2018-12-24 DIAGNOSIS — R739 Hyperglycemia, unspecified: Secondary | ICD-10-CM

## 2018-12-24 DIAGNOSIS — Z91018 Allergy to other foods: Secondary | ICD-10-CM

## 2018-12-24 DIAGNOSIS — G47419 Narcolepsy without cataplexy: Secondary | ICD-10-CM

## 2018-12-24 DIAGNOSIS — I10 Essential (primary) hypertension: Secondary | ICD-10-CM | POA: Diagnosis present

## 2018-12-24 DIAGNOSIS — Z79899 Other long term (current) drug therapy: Secondary | ICD-10-CM

## 2018-12-24 DIAGNOSIS — N179 Acute kidney failure, unspecified: Secondary | ICD-10-CM | POA: Diagnosis present

## 2018-12-24 DIAGNOSIS — Z1159 Encounter for screening for other viral diseases: Secondary | ICD-10-CM

## 2018-12-24 DIAGNOSIS — H409 Unspecified glaucoma: Secondary | ICD-10-CM | POA: Diagnosis present

## 2018-12-24 DIAGNOSIS — K5900 Constipation, unspecified: Secondary | ICD-10-CM | POA: Diagnosis present

## 2018-12-24 DIAGNOSIS — F840 Autistic disorder: Secondary | ICD-10-CM | POA: Diagnosis present

## 2018-12-24 DIAGNOSIS — Z825 Family history of asthma and other chronic lower respiratory diseases: Secondary | ICD-10-CM

## 2018-12-24 HISTORY — DX: Autistic disorder: F84.0

## 2018-12-24 LAB — URINALYSIS, ROUTINE W REFLEX MICROSCOPIC
Bilirubin Urine: NEGATIVE
Glucose, UA: >=500 mg/dL — AB
Ketones, ur: 80 mg/dL — AB
Leukocytes,Ua: NEGATIVE
Nitrite: NEGATIVE
Protein, ur: 100 mg/dL — AB
Specific Gravity, Urine: 1.021 (ref 1.005–1.030)
pH: 5 (ref 5.0–8.0)

## 2018-12-24 LAB — CBC WITH DIFFERENTIAL/PLATELET
Abs Immature Granulocytes: 0.1 10*3/uL — ABNORMAL HIGH (ref 0.00–0.07)
Basophils Absolute: 0 10*3/uL (ref 0.0–0.1)
Basophils Relative: 0 %
Eosinophils Absolute: 0 10*3/uL (ref 0.0–0.5)
Eosinophils Relative: 0 %
HCT: 60.1 % — ABNORMAL HIGH (ref 39.0–52.0)
Hemoglobin: 20.3 g/dL — ABNORMAL HIGH (ref 13.0–17.0)
Immature Granulocytes: 1 %
Lymphocytes Relative: 8 %
Lymphs Abs: 1.2 10*3/uL (ref 0.7–4.0)
MCH: 28.5 pg (ref 26.0–34.0)
MCHC: 33.8 g/dL (ref 30.0–36.0)
MCV: 84.4 fL (ref 80.0–100.0)
Monocytes Absolute: 1.1 10*3/uL — ABNORMAL HIGH (ref 0.1–1.0)
Monocytes Relative: 7 %
Neutro Abs: 13.6 10*3/uL — ABNORMAL HIGH (ref 1.7–7.7)
Neutrophils Relative %: 84 %
Platelets: 286 10*3/uL (ref 150–400)
RBC: 7.12 MIL/uL — ABNORMAL HIGH (ref 4.22–5.81)
RDW: 11.7 % (ref 11.5–15.5)
WBC: 16 10*3/uL — ABNORMAL HIGH (ref 4.0–10.5)
nRBC: 0 % (ref 0.0–0.2)

## 2018-12-24 LAB — COMPREHENSIVE METABOLIC PANEL
ALT: 27 U/L (ref 0–44)
AST: 16 U/L (ref 15–41)
Albumin: 4.5 g/dL (ref 3.5–5.0)
Alkaline Phosphatase: 99 U/L (ref 38–126)
BUN: 24 mg/dL — ABNORMAL HIGH (ref 6–20)
CO2: <7 mmol/L — ABNORMAL LOW (ref 22–32)
Calcium: 9.2 mg/dL (ref 8.9–10.3)
Chloride: 98 mmol/L (ref 98–111)
Creatinine, Ser: 1.94 mg/dL — ABNORMAL HIGH (ref 0.61–1.24)
GFR calc Af Amer: 52 mL/min — ABNORMAL LOW (ref >60)
GFR calc non Af Amer: 44 mL/min — ABNORMAL LOW (ref >60)
Glucose, Bld: 449 mg/dL — ABNORMAL HIGH (ref 70–99)
Potassium: 5.2 mmol/L — ABNORMAL HIGH (ref 3.5–5.1)
Sodium: 131 mmol/L — ABNORMAL LOW (ref 135–145)
Total Bilirubin: 1.6 mg/dL — ABNORMAL HIGH (ref 0.3–1.2)
Total Protein: 8.7 g/dL — ABNORMAL HIGH (ref 6.5–8.1)

## 2018-12-24 LAB — POCT I-STAT EG7
Acid-base deficit: 20 mmol/L — ABNORMAL HIGH (ref 0.0–2.0)
Bicarbonate: 7.6 mmol/L — ABNORMAL LOW (ref 20.0–28.0)
Calcium, Ion: 0.97 mmol/L — ABNORMAL LOW (ref 1.15–1.40)
HCT: 60 % — ABNORMAL HIGH (ref 39.0–52.0)
Hemoglobin: 20.4 g/dL — ABNORMAL HIGH (ref 13.0–17.0)
O2 Saturation: 86 %
Potassium: 5.3 mmol/L — ABNORMAL HIGH (ref 3.5–5.1)
Sodium: 129 mmol/L — ABNORMAL LOW (ref 135–145)
TCO2: 8 mmol/L — ABNORMAL LOW (ref 22–32)
pCO2, Ven: 24 mmHg — ABNORMAL LOW (ref 44.0–60.0)
pH, Ven: 7.11 — CL (ref 7.250–7.430)
pO2, Ven: 68 mmHg — ABNORMAL HIGH (ref 32.0–45.0)

## 2018-12-24 LAB — GLUCOSE, CAPILLARY
Glucose-Capillary: 338 mg/dL — ABNORMAL HIGH (ref 70–99)
Glucose-Capillary: 289 mg/dL — ABNORMAL HIGH (ref 70–99)
Glucose-Capillary: 225 mg/dL — ABNORMAL HIGH (ref 70–99)

## 2018-12-24 LAB — CBC
HCT: 58.5 % — ABNORMAL HIGH (ref 39.0–52.0)
Hemoglobin: 20 g/dL — ABNORMAL HIGH (ref 13.0–17.0)
MCH: 28.6 pg (ref 26.0–34.0)
MCHC: 34.2 g/dL (ref 30.0–36.0)
MCV: 83.7 fL (ref 80.0–100.0)
Platelets: 295 10*3/uL (ref 150–400)
RBC: 6.99 MIL/uL — ABNORMAL HIGH (ref 4.22–5.81)
RDW: 11.8 % (ref 11.5–15.5)
WBC: 16.8 10*3/uL — ABNORMAL HIGH (ref 4.0–10.5)
nRBC: 0 % (ref 0.0–0.2)

## 2018-12-24 LAB — BASIC METABOLIC PANEL
Anion gap: 23 — ABNORMAL HIGH (ref 5–15)
BUN: 23 mg/dL — ABNORMAL HIGH (ref 6–20)
CO2: 8 mmol/L — ABNORMAL LOW (ref 22–32)
Calcium: 9 mg/dL (ref 8.9–10.3)
Chloride: 102 mmol/L (ref 98–111)
Creatinine, Ser: 1.8 mg/dL — ABNORMAL HIGH (ref 0.61–1.24)
GFR calc Af Amer: 56 mL/min — ABNORMAL LOW (ref >60)
GFR calc non Af Amer: 49 mL/min — ABNORMAL LOW (ref >60)
Glucose, Bld: 293 mg/dL — ABNORMAL HIGH (ref 70–99)
Potassium: 4.3 mmol/L (ref 3.5–5.1)
Sodium: 133 mmol/L — ABNORMAL LOW (ref 135–145)

## 2018-12-24 LAB — CBG MONITORING, ED
Glucose-Capillary: 487 mg/dL — ABNORMAL HIGH (ref 70–99)
Glucose-Capillary: 434 mg/dL — ABNORMAL HIGH (ref 70–99)
Glucose-Capillary: 391 mg/dL — ABNORMAL HIGH (ref 70–99)
Glucose-Capillary: 315 mg/dL — ABNORMAL HIGH (ref 70–99)

## 2018-12-24 LAB — POCT FASTING CBG KUC MANUAL ENTRY: POCT Glucose (KUC): 468 mg/dL — AB (ref 70–99)

## 2018-12-24 LAB — LIPASE, BLOOD: Lipase: 39 U/L (ref 11–51)

## 2018-12-24 LAB — SARS CORONAVIRUS 2 BY RT PCR (HOSPITAL ORDER, PERFORMED IN ~~LOC~~ HOSPITAL LAB): SARS Coronavirus 2: NEGATIVE

## 2018-12-24 MED ORDER — ENOXAPARIN SODIUM 40 MG/0.4ML ~~LOC~~ SOLN
40.0000 mg | SUBCUTANEOUS | Status: DC
  Administered 2018-12-24 – 2018-12-26 (×3): 40 mg via SUBCUTANEOUS
  Filled 2018-12-24 (×3): qty 0.4

## 2018-12-24 MED ORDER — ONDANSETRON HCL 4 MG/2ML IJ SOLN
4.0000 mg | Freq: Once | INTRAMUSCULAR | Status: AC
  Administered 2018-12-24: 4 mg via INTRAVENOUS
  Filled 2018-12-24: qty 2

## 2018-12-24 MED ORDER — ALBUTEROL SULFATE (2.5 MG/3ML) 0.083% IN NEBU: 3.0000 mL | INHALATION_SOLUTION | Freq: Four times a day (QID) | RESPIRATORY_TRACT | Status: DC | PRN

## 2018-12-24 MED ORDER — LATANOPROST 0.005 % OP SOLN
1.0000 [drp] | Freq: Every day | OPHTHALMIC | Status: DC
  Administered 2018-12-24 – 2018-12-26 (×3): 1 [drp] via OPHTHALMIC
  Filled 2018-12-24: qty 2.5

## 2018-12-24 MED ORDER — DEXTROSE-NACL 5-0.45 % IV SOLN
INTRAVENOUS | Status: DC
  Administered 2018-12-24: 1000 mL via INTRAVENOUS

## 2018-12-24 MED ORDER — MOMETASONE FURO-FORMOTEROL FUM 200-5 MCG/ACT IN AERO
2.0000 | INHALATION_SPRAY | Freq: Two times a day (BID) | RESPIRATORY_TRACT | Status: DC
  Administered 2018-12-25 – 2018-12-27 (×5): 2 via RESPIRATORY_TRACT
  Filled 2018-12-24: qty 8.8

## 2018-12-24 MED ORDER — SODIUM CHLORIDE 0.9 % IV BOLUS
1000.0000 mL | Freq: Once | INTRAVENOUS | Status: AC
  Administered 2018-12-24: 1000 mL via INTRAVENOUS

## 2018-12-24 MED ORDER — SODIUM CHLORIDE 0.9 % IV SOLN: INTRAVENOUS | Status: AC

## 2018-12-24 MED ORDER — SODIUM CHLORIDE 0.9 % IV SOLN
INTRAVENOUS | Status: DC
  Administered 2018-12-24 – 2018-12-25 (×2): via INTRAVENOUS

## 2018-12-24 MED ORDER — MODAFINIL 100 MG PO TABS
200.0000 mg | ORAL_TABLET | ORAL | Status: DC
  Administered 2018-12-25 – 2018-12-27 (×5): 200 mg via ORAL
  Filled 2018-12-24 (×5): qty 2

## 2018-12-24 MED ORDER — SODIUM CHLORIDE 0.9 % IV SOLN: INTRAVENOUS | Status: DC

## 2018-12-24 MED ORDER — INSULIN REGULAR(HUMAN) IN NACL 100-0.9 UT/100ML-% IV SOLN
INTRAVENOUS | Status: DC
  Administered 2018-12-24: 3.7 [IU]/h via INTRAVENOUS
  Filled 2018-12-24 (×2): qty 100

## 2018-12-24 MED ORDER — INSULIN REGULAR(HUMAN) IN NACL 100-0.9 UT/100ML-% IV SOLN: INTRAVENOUS | Status: DC

## 2018-12-24 NOTE — ED Provider Notes (Signed)
[facility]    CSN: [id number] Arrival date & time: 12/24/18  1203     History   Chief Complaint Chief Complaint  Patient presents with  . Fatigue    HPI Grant [name] is a 33 y.o. male presenting with his mother (caregiver) for SOB and fatigue. Patient's mother provides majority of history.  States that patient has had increase in labor of breathing and fatigue/lethargy today.  Patient has had decreased appetite for the last 2-3 days; has not eaten in 2 days.  Had an episode of non-bloody, non-biliary emesis this morning.  Endorses polydipsia, polyuria; denies hematuria, tea-colored urine, hematochezia.  Patient also notes diffuse abdominal pain, though is unable to articulate the quality/temporality of pain.  Patient's mother notes baseline of constipation which patient takes occasional "detox tea" with adequate relief; last given tea yesterday.  Last BM was last night: denies blood/melena or pain.  Patient denies recent illness, fever, personal h/o of DM though T2DM is prevalent in family. Of note,  patient is followed by Neuro - last seen by his neurologist last month and was doing well at that time; no change in meds.     Past Medical History:  Diagnosis Date  . Allergic rhinitis   . Asthma   . Autism   . 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 Narcolepsy   . Premature delivery    Born at 21 weeks of gestation    Patient Active Problem List   Diagnosis Date Noted  . Mental retardation, idiopathic moderate 03/19/2017  . Narcolepsy 11/04/2013  . Acute asthmatic bronchitis 06/02/2013  . Allergic rhinitis 09/23/2007  . Moderate persistent asthma in adult without complication 09/23/2007    Past Surgical History:  Procedure Laterality Date  . PATENT DUCTUS ARTERIOUS REPAIR         Home Medications    Prior to Admission medications   Medication Sig Start Date End Date Taking? Authorizing Provider  albuterol (PROAIR  HFA) 108 (90 Base) MCG/ACT inhaler Inhale 2 puffs into the lungs every 6 (six) hours as needed. 06/22/16   Leslye Peer, MD  Fluticasone-Salmeterol (ADVAIR DISKUS) 250-50 MCG/DOSE AEPB INHALE 1 PUFF INTO THE LUNGS 2 (TWO) TIMES DAILY. 08/12/17   Leslye Peer, MD  modafinil (PROVIGIL) 200 MG tablet TAKE 1 TABLET BY MOUTH IN  THE MORNING AND 1 TABLET  BETWEEN 12PM AND 3PM IN THE AFTERNOON 11/13/18   Glean Salvo, NP  travoprost, benzalkonium, (TRAVATAN) 0.004 % ophthalmic solution Place 1 drop into both eyes at bedtime.    [provider]    Family History Family History  Problem Relation Age of Onset  . Seizures Father   . Allergies Other        siblings  . Asthma Other        siblings    Social History Social History   Tobacco Use  . Smoking status: Never Smoker  . Smokeless tobacco: Never Used  Substance Use Topics  . Alcohol use: No    Alcohol/week: 0.0 standard drinks  . Drug use: No     Allergies   Patient has no known allergies.   Review of Systems Review of Systems  Constitutional: Positive for activity change, appetite change and fatigue. Negative for fever.  HENT: Negative for trouble swallowing and voice change.   Eyes: Negative for photophobia and visual disturbance.  Respiratory: Positive for cough, shortness of breath and wheezing. Negative for choking and chest  tightness.   Cardiovascular: Negative for chest pain and palpitations.  Gastrointestinal: Positive for abdominal pain, constipation, nausea and vomiting. Negative for abdominal distention, anal bleeding, blood in stool, diarrhea and rectal pain.  Endocrine: Positive for polydipsia and polyuria. Negative for polyphagia.  Genitourinary: Positive for frequency. Negative for decreased urine volume, difficulty urinating, dysuria, enuresis, flank pain, hematuria and urgency.  Skin: Positive for pallor. Negative for wound.  Neurological: Positive for weakness. Negative for seizures, syncope and  headaches.     Physical Exam Triage Vital Signs ED Triage Vitals  Enc Vitals Group     BP 12/24/18 1221 (!) 129/94     Pulse Rate 12/24/18 1221 (!) 112     Resp 12/24/18 1221 (!) 22     Temp 12/24/18 1221 (!) 97.1 F (36.2 C)     Temp Source 12/24/18 1221 Oral     SpO2 12/24/18 1221 96 %     Weight --      Height --      Head Circumference --      Peak Flow --      Pain Score 12/24/18 1222 0     Pain Loc --      Pain Edu? --      Excl. in GC? --    No data found.  Updated Vital Signs BP (!) 129/94 (BP Location: Left Arm)   Pulse (!) 112   Temp (!) 97.1 F (36.2 C) (Oral)   Resp (!) 22   SpO2 96%   Visual Acuity Right Eye Distance:   Left Eye Distance:   Bilateral Distance:    Right Eye Near:   Left Eye Near:    Bilateral Near:     Physical Exam Constitutional:      General: He is in acute distress.     Appearance: He is normal weight. He is ill-appearing.  HENT:     Head: Normocephalic and atraumatic.     Nose: Nose normal.     Mouth/Throat:     Mouth: Mucous membranes are dry.     Pharynx: No oropharyngeal exudate or posterior oropharyngeal erythema.  Eyes:     General: No scleral icterus.    Extraocular Movements: Extraocular movements intact.     Conjunctiva/sclera: Conjunctivae normal.     Pupils: Pupils are equal, round, and reactive to light.  Neck:     Musculoskeletal: Neck supple.  Cardiovascular:     Rate and Rhythm: Regular rhythm. Tachycardia present.  Pulmonary:     Effort: Respiratory distress present.     Breath sounds: No wheezing or rales.     Comments: Labored breathing Abdominal:     General: Abdomen is flat. Bowel sounds are normal. There is no distension.     Tenderness: There is no abdominal tenderness. There is no guarding.  Lymphadenopathy:     Cervical: No cervical adenopathy.  Skin:    General: Skin is dry.     Capillary Refill: Capillary refill takes more than 3 seconds.     Coloration: Skin is pale.  Neurological:      Mental Status: He is alert and oriented to person, place, and time.     Cranial Nerves: No cranial nerve deficit.     Motor: Weakness present.     Coordination: Coordination abnormal.     Gait: Gait abnormal.     Comments: Patient more lethargic/delayed reaction time and gait more unsteady from baseline per mother      UC Treatments / Results  Labs (all labs ordered are listed, but only abnormal results are displayed) Labs Reviewed  POCT FASTING CBG [facility] MANUAL ENTRY - Abnormal; Notable for the following components:      Result Value   POCT Glucose ([facility]) 468 (*)    All other components within normal limits    EKG None  Radiology No results found.  Procedures Procedures (including critical care time)  Medications Ordered in UC Medications - No data to display  Initial Impression / Assessment and Plan / UC Course  I have reviewed the triage vital signs and the nursing notes.  Pertinent labs & imaging results that were available during my care of the patient were reviewed by me and considered in my medical decision making (see chart for details).     33 y.o. male with history of autism presents with his mother for lethargy, decreased appetite, and abdominal pain.  Due to patient's appearance, symptoms, and elevated sugar of 468 w/o known h/o DM, patient was discharged in stable condition to ED for further eval/management.  Patient's mother felt comfortable transporting patient/declined EMS. Final Clinical Impressions(s) / UC Diagnoses   Final diagnoses:  Hyperglycemia  Lethargy  Tachycardia     Discharge Instructions     33 y.o. male with history of autism presents with his mother for lethargy, decreased appetite, and abdominal pain.  Due to patient's appearance, symptoms, and elevated sugar of 468 w/o known h/o DM, patient discharged in stable condition to ED for further eval/management.    ED Prescriptions    None     Controlled Substance Prescriptions [place]  Controlled Substance Registry consulted? Not Applicable   Shea Evans, [place] 12/24/18 1331

## 2018-12-24 NOTE — ED Notes (Signed)
ED TO INPATIENT HANDOFF REPORT  ED Nurse Name and Phone #: [phone] [phone]  S Name/Age/Gender Grant [name] 33 y.o. male Room/Bed: 043C/043C  Code Status   Code Status: Not on file  Home/SNF/Other Home Patient oriented to: self, place, time and situation Is this baseline? Yes   Triage Complete: Triage complete  Chief Complaint hyperglycemia with loss of appetite  Triage Note Pt sent down from UC for hyperglycemia and sob. Mother states pt has been sleeping more, had episode of emesis this am. Denies any fevers, did have a CBG of 468 at UC, no hx of DM   Allergies Allergies  Allergen Reactions  . Other Anaphylaxis, Shortness Of Breath and Swelling    CANNOT HAVE ANY NUTS (in any form) Once had an allergic reaction so bad his nail beds turned blue  . Peanut-Containing Drug Products Anaphylaxis, Shortness Of Breath and Swelling    Level of Care/Admitting Diagnosis ED Disposition    ED Disposition Condition Comment   Admit  The patient appears reasonably stabilized for admission considering the current resources, flow, and capabilities available in the ED at this time, and I doubt any other [facility] requiring further screening and/or treatment in the ED prior to admission is  present.       B Medical/Surgery History Past Medical History:  Diagnosis Date  . Allergic rhinitis   . Asthma   . Autism   . 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 Narcolepsy   . Premature delivery    Born at 21 weeks of gestation   Past Surgical History:  Procedure Laterality Date  . PATENT DUCTUS ARTERIOUS REPAIR       A IV Location/Drains/Wounds Patient Lines/Drains/Airways Status   Active Line/Drains/Airways    Name:   Placement date:   Placement time:   Site:   Days:   Peripheral IV 12/24/18 Right Hand   12/24/18    1410    Hand   less than 1          Intake/Output Last 24 hours  Intake/Output Summary (Last 24 hours) at 12/24/2018 1833 Last  data filed at 12/24/2018 1603 Gross per 24 hour  Intake 950 ml  Output -  Net 950 ml    Labs/Imaging Results for orders placed or performed during the hospital encounter of 12/24/18 (from the past 48 hour(s))  SARS Coronavirus 2 [facility] order, Performed in [facility] hospital lab)     Status: None   Collection Time: 12/24/18  1:57 PM  Result Value Ref Range   SARS Coronavirus 2 NEGATIVE NEGATIVE    Comment: (NOTE) If result is NEGATIVE SARS-CoV-2 target nucleic acids are NOT DETECTED. The SARS-CoV-2 RNA is generally detectable in upper and lower  respiratory specimens during the acute phase of infection. The lowest  concentration of SARS-CoV-2 viral copies this assay can detect is 250  copies / mL. A negative result does not preclude SARS-CoV-2 infection  and should not be used as the sole basis for treatment or other  patient management decisions.  A negative result may occur with  improper specimen collection / handling, submission of specimen other  than nasopharyngeal swab, presence of viral mutation(s) within the  areas targeted by this assay, and inadequate number of viral copies  (<250 copies / mL). A negative result must be combined with clinical  observations, patient history, and epidemiological information. If result is POSITIVE SARS-CoV-2 target nucleic acids are DETECTED. The SARS-CoV-2 RNA is generally  detectable in upper and lower  respiratory specimens dur ing the acute phase of infection.  Positive  results are indicative of active infection with SARS-CoV-2.  Clinical  correlation with patient history and other diagnostic information is  necessary to determine patient infection status.  Positive results do  not rule out bacterial infection or co-infection with other viruses. If result is PRESUMPTIVE POSTIVE SARS-CoV-2 nucleic acids MAY BE PRESENT.   A presumptive positive result was obtained on the submitted specimen  and confirmed on repeat testing.  While  2019 novel coronavirus  (SARS-CoV-2) nucleic acids may be present in the submitted sample  additional confirmatory testing may be necessary for epidemiological  and / or clinical management purposes  to differentiate between  SARS-CoV-2 and other Sarbecovirus currently known to infect humans.  If clinically indicated additional testing with an alternate test  methodology [phone]) is advised. The SARS-CoV-2 RNA is generally  detectable in upper and lower respiratory sp ecimens during the acute  phase of infection. The expected result is Negative. Fact Sheet for Patients:  BoilerBrush.com.cy Fact Sheet for Healthcare Providers: https://pope.com/ This test is not yet approved or cleared by the [place] FDA and has been authorized for detection and/or diagnosis of SARS-CoV-2 by FDA under an Emergency Use Authorization (EUA).  This EUA will remain in effect (meaning this test can be used) for the duration of the COVID-19 declaration under Section 564(b)(1) of the Act, 21 U.S.C. section 360bbb-3(b)(1), unless the authorization is terminated or revoked sooner. Performed at [facility], [address] [place], [place] [address]   CBG monitoring, ED     Status: Abnormal   Collection Time: 12/24/18  2:08 PM  Result Value Ref Range   Glucose-Capillary 487 (H) 70 - 99 mg/dL  Urinalysis, Routine w reflex microscopic     Status: Abnormal   Collection Time: 12/24/18  2:24 PM  Result Value Ref Range   Color, Urine YELLOW YELLOW   APPearance CLEAR CLEAR   Specific Gravity, Urine 1.021 1.005 - 1.030   pH 5.0 5.0 - 8.0   Glucose, UA >=500 (A) NEGATIVE mg/dL   Hgb urine dipstick MODERATE (A) NEGATIVE   Bilirubin Urine NEGATIVE NEGATIVE   Ketones, ur 80 (A) NEGATIVE mg/dL   Protein, ur 909 (A) NEGATIVE mg/dL   Nitrite NEGATIVE NEGATIVE   Leukocytes,Ua NEGATIVE NEGATIVE   RBC / HPF 0-5 0 - 5 RBC/hpf   WBC, UA 0-5 0 - 5 WBC/hpf    Bacteria, UA RARE (A) NONE SEEN   Squamous Epithelial / LPF 0-5 0 - 5   Mucus PRESENT    Hyaline Casts, UA PRESENT     Comment: Performed at [facility], [address], [place], [place] [address]  POCT I-Stat EG7     Status: Abnormal   Collection Time: 12/24/18  3:33 PM  Result Value Ref Range   pH, Ven 7.110 (LL) 7.250 - 7.430   pCO2, Ven 24.0 (L) 44.0 - 60.0 mmHg   pO2, Ven 68.0 (H) 32.0 - 45.0 mmHg   Bicarbonate 7.6 (L) 20.0 - 28.0 mmol/L   TCO2 8 (L) 22 - 32 mmol/L   O2 Saturation 86.0 %   Acid-base deficit 20.0 (H) 0.0 - 2.0 mmol/L   Sodium 129 (L) 135 - 145 mmol/L   Potassium 5.3 (H) 3.5 - 5.1 mmol/L   Calcium, Ion 0.97 (L) 1.15 - 1.40 mmol/L   HCT 60.0 (H) 39.0 - 52.0 %   Hemoglobin 20.4 (H) 13.0 -  17.0 g/dL   Patient temperature HIDE    Sample type VENOUS    Comment NOTIFIED PHYSICIAN   CBC with Differential     Status: Abnormal   Collection Time: 12/24/18  3:40 PM  Result Value Ref Range   WBC 16.0 (H) 4.0 - 10.5 K/uL   RBC 7.12 (H) 4.22 - 5.81 MIL/uL   Hemoglobin 20.3 (H) 13.0 - 17.0 g/dL   HCT 16.1 (H) 09.6 - 04.5 %   MCV 84.4 80.0 - 100.0 fL   MCH 28.5 26.0 - 34.0 pg   MCHC 33.8 30.0 - 36.0 g/dL   RDW 40.9 81.1 - 91.4 %   Platelets 286 150 - 400 K/uL   nRBC 0.0 0.0 - 0.2 %   Neutrophils Relative % 84 %   Neutro Abs 13.6 (H) 1.7 - 7.7 K/uL   Lymphocytes Relative 8 %   Lymphs Abs 1.2 0.7 - 4.0 K/uL   Monocytes Relative 7 %   Monocytes Absolute 1.1 (H) 0.1 - 1.0 K/uL   Eosinophils Relative 0 %   Eosinophils Absolute 0.0 0.0 - 0.5 K/uL   Basophils Relative 0 %   Basophils Absolute 0.0 0.0 - 0.1 K/uL   Immature Granulocytes 1 %   Abs Immature Granulocytes 0.10 (H) 0.00 - 0.07 K/uL    Comment: Performed at [facility], [address] [place], [place] [address]  Lipase, blood     Status: None   Collection Time: 12/24/18  4:48 PM  Result Value Ref Range   Lipase 39 11 - 51 U/L    Comment: Performed at [facility], [address],  [place], [place] [address]  Comprehensive metabolic panel     Status: Abnormal   Collection Time: 12/24/18  4:48 PM  Result Value Ref Range   Sodium 131 (L) 135 - 145 mmol/L   Potassium 5.2 (H) 3.5 - 5.1 mmol/L    Comment: SLIGHT HEMOLYSIS   Chloride 98 98 - 111 mmol/L   CO2 <7 (L) 22 - 32 mmol/L   Glucose, Bld 449 (H) 70 - 99 mg/dL   BUN 24 (H) 6 - 20 mg/dL   Creatinine, Ser 8.65 (H) 0.61 - 1.24 mg/dL   Calcium 9.2 8.9 - 78.4 mg/dL   Total Protein 8.7 (H) 6.5 - 8.1 g/dL   Albumin 4.5 3.5 - 5.0 g/dL   AST 16 15 - 41 U/L   ALT 27 0 - 44 U/L   Alkaline Phosphatase 99 38 - 126 U/L   Total Bilirubin 1.6 (H) 0.3 - 1.2 mg/dL   GFR calc non Af Amer 44 (L) >60 mL/min   GFR calc Af Amer 52 (L) >60 mL/min   Anion gap NOT CALCULATED 5 - 15    Comment: Performed at [facility], [address] [place], [place] [address]  POC CBG, ED     Status: Abnormal   Collection Time: 12/24/18  5:13 PM  Result Value Ref Range   Glucose-Capillary 434 (H) 70 - 99 mg/dL  CBG monitoring, ED     Status: Abnormal   Collection Time: 12/24/18  6:19 PM  Result Value Ref Range   Glucose-Capillary 391 (H) 70 - 99 mg/dL   Dg Chest Port 1 View  Result Date: 12/24/2018 CLINICAL DATA:  Short of breath EXAM: PORTABLE CHEST 1 VIEW COMPARISON:  None. FINDINGS: The heart size and mediastinal contours are within normal limits. Both lungs are clear. The visualized skeletal structures are unremarkable. IMPRESSION: No  active disease. Electronically Signed   By: Marlan Palauharles  Clark M.D.   On: 12/24/2018 13:57    Pending Labs Unresulted Labs (From admission, onward)    Start     Ordered   12/24/18 1339  CBC with Differential  (ED Abdominal Pain)  ONCE - STAT,   STAT     12/24/18 1338          Vitals/Pain Today's Vitals   12/24/18 1604 12/24/18 1615 12/24/18 1638 12/24/18 1730  BP:   (!) 153/98 (!) 147/90  Pulse:  (!) 110 (!) 112 (!) 113  Resp:  15 17 20   Temp:      TempSrc:      SpO2:  97% 94% 96%  Weight:       Height:      PainSc: 0-No pain       Isolation Precautions No active isolations  Medications Medications  0.9 %  sodium chloride infusion ( Intravenous New Bag/Given 12/24/18 1716)  insulin regular, human (MYXREDLIN) 100 units/ 100 mL infusion (3.7 Units/hr Intravenous New Bag/Given 12/24/18 1715)  sodium chloride 0.9 % bolus 1,000 mL (0 mLs Intravenous Stopped 12/24/18 1603)  ondansetron (ZOFRAN) injection 4 mg (4 mg Intravenous Given 12/24/18 1419)    Mobility walks Low fall risk   Focused Assessments    R Recommendations: See Admitting Provider Note  Report given to:   Additional Notes: Autistic, pt with mother.

## 2018-12-24 NOTE — ED Notes (Signed)
Attempted to give report to floor. Will call back.

## 2018-12-24 NOTE — ED Notes (Signed)
Lab called stating all the blood tubes were clotted, phlebotomy notified to redraw labs

## 2018-12-24 NOTE — Discharge Instructions (Signed)
33 y.o. male with history of autism presents with his mother for lethargy, decreased appetite, and abdominal pain.  Due to patient's appearance, symptoms, and elevated sugar of 468 w/o known h/o DM, patient discharged in stable condition to ED for further eval/management.

## 2018-12-24 NOTE — ED Triage Notes (Signed)
Pt mom pts caregiver states pt has been fatigue, laying around, c/o abdominal pain d/t constipation. Had a BM last night.

## 2018-12-24 NOTE — ED Triage Notes (Signed)
Pt sent down from UC for hyperglycemia and sob. Mother states pt has been sleeping more, had episode of emesis this am. Denies any fevers, did have a CBG of 468 at UC, no hx of DM

## 2018-12-24 NOTE — H&P (Signed)
History and Physical   Grant [name] KCL:[id number] DOB: 1986-06-01 DOA: 12/24/2018  Referring MD/NP/PA: Dr. Fredderick Phenix  PCP: Renford Dills, MD   Outpatient Specialists: None  Patient coming from: Home  Chief Complaint: Abdominal pain and vomiting  HPI: Grant [name] is a 33 y.o. male with medical history significant of autism, premature birth, asthma, narcolepsy, glaucoma who was brought in by family due to excessive hiccups, abdominal pain as well as nausea and vomiting.  Patient has strong family history of diabetes.  He is never had any issues with his blood sugar.  He has been relatively healthy otherwise.  Patient's symptoms started about 2 to 3 days ago.  He has some polydipsia and polyphagia.  He has been drinking a lot of water in the last 2 days.  He was brought in after trial of tea detox by the mother.  She took him to urgent care center where they evaluated him and found him to have elevated blood sugar and was sent to the ER.  In the ER patient was found to have diabetic ketoacidosis.  pH is 7.11 with ketones and elevated gap.  This is newly diagnosed diabetes with diabetic ketoacidosis.  He is being admitted to the hospital for further work-up.  Patient is not a smoker and does not drink alcohol.  He does eat frosted flakes cereals a lot but otherwise not eating a lot of junk food.  Patient denied being on any steroids lately.  ED Course: Temperature is 97.1 blood pressure 129/94 pulse 122 respirate of 22 and oxygen sat 94% room air.  Sodium 133 potassium 4.3 chloride 102 CO2 of 8 glucose 440 BUN 23 creatinine 1.80 gap is 23.  White count 16.8 hemoglobin 20 and platelet count of 295.  Initial sodium was 129.  pH 7.110.  Urinalysis shows significant glucosuria moderate hemoglobin positive ketones up to 80.  Patient has been diagnosed with diabetic ketoacidosis initiated on IV fluids and IV insulin and being admitted to the progressive care unit.  Review of Systems: As per HPI  otherwise 10 point review of systems negative.    Past Medical History:  Diagnosis Date  . Allergic rhinitis   . Asthma   . Autism   . 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 Narcolepsy   . Premature delivery    Born at 21 weeks of gestation    Past Surgical History:  Procedure Laterality Date  . PATENT DUCTUS ARTERIOUS REPAIR       reports that he has never smoked. He has never used smokeless tobacco. He reports that he does not drink alcohol or use drugs.  Allergies  Allergen Reactions  . Other Anaphylaxis, Shortness Of Breath and Swelling    CANNOT HAVE ANY NUTS (in any form) Once had an allergic reaction so bad his nail beds turned blue  . Peanut-Containing Drug Products Anaphylaxis, Shortness Of Breath and Swelling    Family History  Problem Relation Age of Onset  . Seizures Father   . Allergies Other        siblings  . Asthma Other        siblings     Prior to Admission medications   Medication Sig Start Date End Date Taking? Authorizing Provider  albuterol (PROAIR HFA) 108 (90 Base) MCG/ACT inhaler Inhale 2 puffs into the lungs every 6 (six) hours as needed. Patient taking differently: Inhale 2 puffs into the lungs every 6 (six) hours as needed for  wheezing or shortness of breath.  06/22/16  Yes Leslye Peer, MD  Fluticasone-Salmeterol (ADVAIR DISKUS) 250-50 MCG/DOSE AEPB INHALE 1 PUFF INTO THE LUNGS 2 (TWO) TIMES DAILY. Patient taking differently: Inhale 1 puff into the lungs 2 (two) times a day.  08/12/17  Yes Byrum, Les Pou, MD  modafinil (PROVIGIL) 200 MG tablet TAKE 1 TABLET BY MOUTH IN  THE MORNING AND 1 TABLET  BETWEEN 12PM AND 3PM IN THE AFTERNOON Patient taking differently: Take 200 mg by mouth See admin instructions. Take 200 mg by mouth in the morning and 200 mg between 12 NOON-3 PM daily 11/13/18  Yes Glean Salvo, NP  Travoprost, BAK Free, (TRAVATAN) 0.004 % SOLN ophthalmic solution Place 1 drop into both eyes at  bedtime.  12/14/18  Yes [provider]    Physical Exam: Vitals:   12/24/18 1430 12/24/18 1615 12/24/18 1638 12/24/18 1730  BP: (!) 154/98  (!) 153/98 (!) 147/90  Pulse: (!) 103 (!) 110 (!) 112 (!) 113  Resp: Temp:      TempSrc:      SpO2: 98% 97% 94% 96%  Weight:      Height:          Constitutional: NAD, patient has persistent hiccups, no agitation Vitals:   12/24/18 1430 12/24/18 1615 12/24/18 1638 12/24/18 1730  BP: (!) 154/98  (!) 153/98 (!) 147/90  Pulse: (!) 103 (!) 110 (!) 112 (!) 113  Resp: Temp:      TempSrc:      SpO2: 98% 97% 94% 96%  Weight:      Height:       Eyes: PERRL, lids and conjunctivae normal ENMT: Mucous membranes are very dry. Posterior pharynx clear of any exudate or lesions.Normal dentition.  Neck: normal, supple, no masses, no thyromegaly Respiratory: clear to auscultation bilaterally, no wheezing, no crackles. Normal respiratory effort. No accessory muscle use.  Cardiovascular: Regular rate and rhythm, no murmurs / rubs / gallops. No extremity edema. 2+ pedal pulses. No carotid bruits.  Abdomen: no tenderness, no masses palpated. No hepatosplenomegaly. Bowel sounds positive.  Musculoskeletal: no clubbing / cyanosis. No joint deformity upper and lower extremities. Good ROM, no contractures. Normal muscle tone.  Skin: no rashes, lesions, ulcers. No induration Neurologic: CN 2-12 grossly intact. Sensation intact, DTR normal. Strength 5/5 in all 4.  Psychiatric: Fully awake alert and oriented.  Participating in conversation.  Normal mood.     Labs on Admission: I have personally reviewed following labs and imaging studies  CBC: Recent Labs  Lab 12/24/18 1533 12/24/18 1540  WBC  --  16.0*  NEUTROABS  --  13.6*  HGB 20.4* 20.3*  HCT 60.0* 60.1*  MCV  --  84.4  PLT  --  286   Basic Metabolic Panel: Recent Labs  Lab 12/24/18 1533 12/24/18 1648  NA 129* 131*  K 5.3* 5.2*  CL  --  98  CO2  --  <7*   GLUCOSE  --  449*  BUN  --  24*  CREATININE  --  1.94*  CALCIUM  --  9.2   GFR: Estimated Creatinine Clearance: 51.1 mL/min (A) (by C-G formula based on SCr of 1.94 mg/dL (H)). Liver Function Tests: Recent Labs  Lab 12/24/18 1648  AST 16  ALT 27  ALKPHOS 99  BILITOT 1.6*  PROT 8.7*  ALBUMIN 4.5   Recent Labs  Lab 12/24/18 1648  LIPASE 39   No  results for input(s): AMMONIA in the last 168 hours. Coagulation Profile: No results for input(s): INR, PROTIME in the last 168 hours. Cardiac Enzymes: No results for input(s): CKTOTAL, CKMB, CKMBINDEX, TROPONINI in the last 168 hours. BNP (last 3 results) No results for input(s): PROBNP in the last 8760 hours. HbA1C: No results for input(s): HGBA1C in the last 72 hours. CBG: Recent Labs  Lab 12/24/18 1408 12/24/18 1713 12/24/18 1819  GLUCAP 487* 434* 391*   Lipid Profile: No results for input(s): CHOL, HDL, LDLCALC, TRIG, CHOLHDL, LDLDIRECT in the last 72 hours. Thyroid Function Tests: No results for input(s): TSH, T4TOTAL, FREET4, T3FREE, THYROIDAB in the last 72 hours. Anemia Panel: No results for input(s): VITAMINB12, FOLATE, FERRITIN, TIBC, IRON, RETICCTPCT in the last 72 hours. Urine analysis:    Component Value Date/Time   COLORURINE YELLOW 12/24/2018 1424   APPEARANCEUR CLEAR 12/24/2018 1424   LABSPEC 1.021 12/24/2018 1424   PHURINE 5.0 12/24/2018 1424   GLUCOSEU >=500 (A) 12/24/2018 1424   HGBUR MODERATE (A) 12/24/2018 1424   BILIRUBINUR NEGATIVE 12/24/2018 1424   KETONESUR 80 (A) 12/24/2018 1424   PROTEINUR 100 (A) 12/24/2018 1424   NITRITE NEGATIVE 12/24/2018 1424   LEUKOCYTESUR NEGATIVE 12/24/2018 1424   Sepsis Labs: (procalcitonin:4,lacticidven:4) ) Recent Results (from the past 240 hour(s))  SARS Coronavirus 2 [facility] order, Performed in [facility] hospital lab)     Status: None   Collection Time: 12/24/18  1:57 PM  Result Value Ref Range Status   SARS Coronavirus 2 NEGATIVE NEGATIVE  Final    Comment: (NOTE) If result is NEGATIVE SARS-CoV-2 target nucleic acids are NOT DETECTED. The SARS-CoV-2 RNA is generally detectable in upper and lower  respiratory specimens during the acute phase of infection. The lowest  concentration of SARS-CoV-2 viral copies this assay can detect is 250  copies / mL. A negative result does not preclude SARS-CoV-2 infection  and should not be used as the sole basis for treatment or other  patient management decisions.  A negative result may occur with  improper specimen collection / handling, submission of specimen other  than nasopharyngeal swab, presence of viral mutation(s) within the  areas targeted by this assay, and inadequate number of viral copies  (<250 copies / mL). A negative result must be combined with clinical  observations, patient history, and epidemiological information. If result is POSITIVE SARS-CoV-2 target nucleic acids are DETECTED. The SARS-CoV-2 RNA is generally detectable in upper and lower  respiratory specimens dur ing the acute phase of infection.  Positive  results are indicative of active infection with SARS-CoV-2.  Clinical  correlation with patient history and other diagnostic information is  necessary to determine patient infection status.  Positive results do  not rule out bacterial infection or co-infection with other viruses. If result is PRESUMPTIVE POSTIVE SARS-CoV-2 nucleic acids MAY BE PRESENT.   A presumptive positive result was obtained on the submitted specimen  and confirmed on repeat testing.  While 2019 novel coronavirus  (SARS-CoV-2) nucleic acids may be present in the submitted sample  additional confirmatory testing may be necessary for epidemiological  and / or clinical management purposes  to differentiate between  SARS-CoV-2 and other Sarbecovirus currently known to infect humans.  If clinically indicated additional testing with an alternate test  methodology [phone]) is advised. The  SARS-CoV-2 RNA is generally  detectable in upper and lower respiratory sp ecimens during the acute  phase of infection. The expected result is Negative. Fact Sheet for Patients:  BoilerBrush.com.cy Fact  Sheet for Healthcare Providers: https://pope.com/ This test is not yet approved or cleared by the [place] and has been authorized for detection and/or diagnosis of SARS-CoV-2 by FDA under an Emergency Use Authorization (EUA).  This EUA will remain in effect (meaning this test can be used) for the duration of the COVID-19 declaration under Section 564(b)(1) of the Act, 21 U.S.C. section 360bbb-3(b)(1), unless the authorization is terminated or revoked sooner. Performed at [facility], [address], [place], [place] [address]      Radiological Exams on Admission: Dg Chest Port 1 View  Result Date: 12/24/2018 CLINICAL DATA:  Short of breath EXAM: PORTABLE CHEST 1 VIEW COMPARISON:  None. FINDINGS: The heart size and mediastinal contours are within normal limits. Both lungs are clear. The visualized skeletal structures are unremarkable. IMPRESSION: No active disease. Electronically Signed   By: Marlan Palau M.D.   On: 12/24/2018 13:57      Assessment/Plan Principal Problem:   DKA (diabetic ketoacidoses) (HCC) Active Problems:   Moderate persistent asthma in adult without complication   Idiopathic moderate intellectual disability   HTN (hypertension)     #1 diabetic ketoacidosis: In a patient that is newly diagnosed diabetic.  Patient will be admitted to progressive care unit.  Initiate the DKA protocol with IV insulin and IV fluids.  Once gap is closed transition to subcutaneous insulin at this point.  We may consider [place] 70/30 insulin twice a day as a starter.  Dietary counseling and diabetic education.  #2 history of asthma: Continue empiric breathing treatments from home.  Not in any exacerbation at the moment.   #3 persistent hiccups: Most likely related to his acidosis.  Fluid resuscitation.  Supportive care.  #4 hypertension: Not on any antihypertensives at home.  Currently blood pressure elevated.  Monitor closely.  #5 mild intellectual disability: Patient otherwise able to get along.  Awake and alert and oriented.  He was born at 21 weeks and so has has some developmental delay.  Mother available over the phone for all questions.  #6 AKI: Most likely prerenal due to dehydration.  Follow renal function as patient is hydrated.  Initial creatinine was about 2.     DVT prophylaxis: Lovenox Code Status: Full code Family Communication: Mother who is at bedside Disposition Plan: Home Consults called: None Admission status: Inpatient to progressive care  Severity of Illness: The appropriate patient status for this patient is INPATIENT. Inpatient status is judged to be reasonable and necessary in order to provide the required intensity of service to ensure the patient's safety. The patient's presenting symptoms, physical exam findings, and initial radiographic and laboratory data in the context of their chronic comorbidities is felt to place them at high risk for further clinical deterioration. Furthermore, it is not anticipated that the patient will be medically stable for discharge from the hospital within 2 midnights of admission. The following factors support the patient status of inpatient.   " The patient's presenting symptoms include abdominal pain nausea and vomiting. " The worrisome physical exam findings include dry mucous membranes and skin. " The initial radiographic and laboratory data are worrisome because of sugar of 449 and gap of 23. " The chronic co-morbidities include asthma.   * I certify that at the point of admission it is my clinical judgment that the patient will require inpatient hospital care spanning beyond 2 midnights from the point of admission due to high intensity of  service, high risk for further deterioration and high  frequency of surveillance required.Lonia Blood*    ,LAWAL MD [facility] Pager [phone]  If 7PM-7AM, please contact night-coverage [email or web address] Password [email or web address]  12/24/2018, 7:21 PM

## 2018-12-24 NOTE — ED Provider Notes (Signed)
[facility]   CSN: [id number] Arrival date & time: 12/24/18  1313    History   Chief Complaint Chief Complaint  Patient presents with  . Abdominal Pain  . Hyperglycemia  . Emesis    HPI Alver FisherMartin C Rosenstock is a 33 y.o. male with a PMH of Autism, Narcolepsy, Asthma, and Allergic Rhinitis presenting with constant generalized abdominal pain onset 2-3 days ago. Mother is a contributing historian. Patient is unable to describe or localize abdominal pain. Mother states patient had one episode of vomiting this morning. Mother states patient was evaluated by an UC and advised to come to the ER after blood glucose was 468. Mother reports fatigue, polyuria, polydipsia, faster breathing, and constipation. Mother states patient last ate yesterday. Mother reports loss of appetite. Mother states she provided Detox tea with relief of constipation. Mother reports last BM was today and it was normal. Patient does not have a history of diabetes. Mother denies any abdominal surgeries. Mother denies fever, cough, congestion, chest pain, leg pain/edema, sick exposures, or recent travel. Mother denies dysuria, hematuria, or flank pain. Mother denies alcohol, tobacco, or drug use. Mother reports a family history of type 2 diabetes.      HPI  Past Medical History:  Diagnosis Date  . Allergic rhinitis   . Asthma   . Autism   . 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 Narcolepsy   . Premature delivery    Born at 21 weeks of gestation    Patient Active Problem List   Diagnosis Date Noted  . Mental retardation, idiopathic moderate 03/19/2017  . Narcolepsy 11/04/2013  . Acute asthmatic bronchitis 06/02/2013  . Allergic rhinitis 09/23/2007  . Moderate persistent asthma in adult without complication 09/23/2007    Past Surgical History:  Procedure Laterality Date  . PATENT DUCTUS ARTERIOUS REPAIR          Home  Medications    Prior to Admission medications   Medication Sig Start Date End Date Taking? Authorizing Provider  albuterol (PROAIR HFA) 108 (90 Base) MCG/ACT inhaler Inhale 2 puffs into the lungs every 6 (six) hours as needed. Patient taking differently: Inhale 2 puffs into the lungs every 6 (six) hours as needed for wheezing or shortness of breath.  06/22/16  Yes Leslye PeerByrum, Robert S, MD  Fluticasone-Salmeterol (ADVAIR DISKUS) 250-50 MCG/DOSE AEPB INHALE 1 PUFF INTO THE LUNGS 2 (TWO) TIMES DAILY. Patient taking differently: Inhale 1 puff into the lungs 2 (two) times a day.  08/12/17  Yes Byrum, Les Pouobert S, MD  modafinil (PROVIGIL) 200 MG tablet TAKE 1 TABLET BY MOUTH IN  THE MORNING AND 1 TABLET  BETWEEN 12PM AND 3PM IN THE AFTERNOON Patient taking differently: Take 200 mg by mouth See admin instructions. Take 200 mg by mouth in the morning and 200 mg between 12 NOON-3 PM daily 11/13/18  Yes Glean SalvoSlack, Sarah J, NP  Travoprost, BAK Free, (TRAVATAN) 0.004 % SOLN ophthalmic solution Place 1 drop into both eyes at bedtime.  12/14/18  Yes [provider]    Family History Family History  Problem Relation Age of Onset  . Seizures Father   . Allergies Other        siblings  . Asthma Other        siblings    Social History Social History   Tobacco Use  . Smoking status: Never Smoker  . Smokeless tobacco: Never Used  Substance Use Topics  .  Alcohol use: No    Alcohol/week: 0.0 standard drinks  . Drug use: No     Allergies   Other and Peanut-containing drug products   Review of Systems Review of Systems  Constitutional: Positive for appetite change. Negative for activity change, chills, fever and unexpected weight change.  HENT: Negative for congestion, rhinorrhea and sore throat.   Eyes: Negative for visual disturbance.  Respiratory: Negative for cough, shortness of breath and wheezing.        Mother reports rapid breathing.  Cardiovascular: Negative for chest pain and leg swelling.   Gastrointestinal: Positive for abdominal pain, constipation, nausea and vomiting. Negative for diarrhea.  Endocrine: Positive for polydipsia and polyuria. Negative for polyphagia.  Genitourinary: Negative for dysuria, flank pain, frequency, hematuria, penile pain, penile swelling, scrotal swelling and testicular pain.  Musculoskeletal: Negative for back pain.  Skin: Negative for rash.  Allergic/Immunologic: Negative for immunocompromised state.  Psychiatric/Behavioral: The patient is not nervous/anxious.     Physical Exam Updated Vital Signs BP (!) 147/90   Pulse (!) 113   Temp 97.6 F (36.4 C) (Oral)   Resp 20   Ht 5\' 7"  (1.702 m)   Wt 74.8 kg   SpO2 96%   BMI 25.84 kg/m   Physical Exam Vitals signs and nursing note reviewed.  Constitutional:      General: He is not in acute distress.    Appearance: He is well-developed. He is ill-appearing. He is not diaphoretic.  HENT:     Head: Normocephalic and atraumatic.     Mouth/Throat:     Mouth: Mucous membranes are dry.     Pharynx: Uvula midline.  Eyes:     General: No scleral icterus. Neck:     Musculoskeletal: Normal range of motion and neck supple.  Cardiovascular:     Rate and Rhythm: Regular rhythm. Tachycardia present.     Heart sounds: Normal heart sounds. No murmur. No friction rub. No gallop.   Pulmonary:     Effort: Pulmonary effort is normal. No respiratory distress.     Breath sounds: Normal breath sounds. No wheezing or rales.  Abdominal:     General: Abdomen is flat. Bowel sounds are normal. There is no distension.     Palpations: Abdomen is soft. Abdomen is not rigid. There is no mass.     Tenderness: There is generalized abdominal tenderness. There is no guarding or rebound.     Hernia: No hernia is present.  Musculoskeletal: Normal range of motion.  Skin:    Findings: No rash.  Neurological:     Mental Status: He is alert.    ED Treatments / Results  Labs (all labs ordered are listed, but only  abnormal results are displayed) Labs Reviewed  URINALYSIS, ROUTINE W REFLEX MICROSCOPIC - Abnormal; Notable for the following components:      Result Value   Glucose, UA >=500 (*)    Hgb urine dipstick MODERATE (*)    Ketones, ur 80 (*)    Protein, ur 100 (*)    Bacteria, UA RARE (*)    All other components within normal limits  CBC WITH DIFFERENTIAL/PLATELET - Abnormal; Notable for the following components:   WBC 16.0 (*)    RBC 7.12 (*)    Hemoglobin 20.3 (*)    HCT 60.1 (*)    Neutro Abs 13.6 (*)    Monocytes Absolute 1.1 (*)    Abs Immature Granulocytes 0.10 (*)    All other components within normal limits  COMPREHENSIVE  METABOLIC PANEL - Abnormal; Notable for the following components:   Sodium 131 (*)    Potassium 5.2 (*)    CO2 <7 (*)    Glucose, Bld 449 (*)    BUN 24 (*)    Creatinine, Ser 1.94 (*)    Total Protein 8.7 (*)    Total Bilirubin 1.6 (*)    GFR calc non Af Amer 44 (*)    GFR calc Af Amer 52 (*)    All other components within normal limits  CBG MONITORING, ED - Abnormal; Notable for the following components:   Glucose-Capillary 487 (*)    All other components within normal limits  POCT I-STAT EG7 - Abnormal; Notable for the following components:   pH, Ven 7.110 (*)    pCO2, Ven 24.0 (*)    pO2, Ven 68.0 (*)    Bicarbonate 7.6 (*)    TCO2 8 (*)    Acid-base deficit 20.0 (*)    Sodium 129 (*)    Potassium 5.3 (*)    Calcium, Ion 0.97 (*)    HCT 60.0 (*)    Hemoglobin 20.4 (*)    All other components within normal limits  CBG MONITORING, ED - Abnormal; Notable for the following components:   Glucose-Capillary 434 (*)    All other components within normal limits  CBG MONITORING, ED - Abnormal; Notable for the following components:   Glucose-Capillary 391 (*)    All other components within normal limits  SARS CORONAVIRUS 2 (HOSPITAL ORDER, PERFORMED IN  HOSPITAL LAB)  LIPASE, BLOOD  CBC WITH DIFFERENTIAL/PLATELET    EKG None   Radiology Dg Chest Port 1 View  Result Date: 12/24/2018 CLINICAL DATA:  Short of breath EXAM: PORTABLE CHEST 1 VIEW COMPARISON:  None. FINDINGS: The heart size and mediastinal contours are within normal limits. Both lungs are clear. The visualized skeletal structures are unremarkable. IMPRESSION: No active disease. Electronically Signed   By: Marlan Palau M.D.   On: 12/24/2018 13:57    Procedures Procedures (including critical care time)  Medications Ordered in ED Medications  0.9 %  sodium chloride infusion ( Intravenous New Bag/Given 12/24/18 1716)  insulin regular, human (MYXREDLIN) 100 units/ 100 mL infusion (3.7 Units/hr Intravenous New Bag/Given 12/24/18 1715)  sodium chloride 0.9 % bolus 1,000 mL (0 mLs Intravenous Stopped 12/24/18 1603)  ondansetron (ZOFRAN) injection 4 mg (4 mg Intravenous Given 12/24/18 1419)     Initial Impression / Assessment and Plan / ED Course  I have reviewed the triage vital signs and the nursing notes.  Pertinent labs & imaging results that were available during my care of the patient were reviewed by me and considered in my medical decision making (see chart for details).  Clinical Course as of Dec 23 1921  Wed Dec 24, 2018  1401 No active cardiopulmonary disease noted on CXR.  DG Chest Port 1 View [AH]  1507 UA reveals ketones, protein, moderate Hgb, rare bacteria, and >500 glucose.   Ketones, ur(!): 80 [AH]  1508 Glucose elevated at 487. Will provide IVF.  Glucose-Capillary(!): 487 [AH]  1832 PH is 7.11. pCO2 is 24. Bicarbonate is 7.6.   pH, Ven(!!): 7.110 [AH]    Clinical Course User Index [AH] Leretha Dykes, PA-C      Patient presents with abdominal pain, nausea, vomiting, and hyperglycemia. UA reveals ketones, protein, and glucose. CBG is elevated at 487. Venous blood gas reveals pH is 7.11. Potassium is 5.3. Patient has been diagnosed with DKA. Provided  IVF, zofran, and insulin for DKA. Patient will need admission for DKA. Consulted  hospitalist, Dr. Mikeal Hawthorne, for admission. Hospitalist has agreed to admit patient.   Findings and plan of care discussed with supervising physician Dr. Fredderick Phenix.  Final Clinical Impressions(s) / ED Diagnoses   Final diagnoses:  Diabetic ketoacidosis without coma associated with other specified diabetes mellitus [facility])    ED Discharge Orders    None       Leretha Dykes, [place] 12/24/18 1925    Rolan Bucco, MD 12/29/18 1144

## 2018-12-25 DIAGNOSIS — J4540 Moderate persistent asthma, uncomplicated: Secondary | ICD-10-CM

## 2018-12-25 DIAGNOSIS — E1310 Other specified diabetes mellitus with ketoacidosis without coma: Secondary | ICD-10-CM

## 2018-12-25 DIAGNOSIS — F71 Moderate intellectual disabilities: Secondary | ICD-10-CM

## 2018-12-25 LAB — BASIC METABOLIC PANEL
Anion gap: 15 (ref 5–15)
Anion gap: 12 (ref 5–15)
Anion gap: 8 (ref 5–15)
BUN: 20 mg/dL (ref 6–20)
BUN: 18 mg/dL (ref 6–20)
BUN: 17 mg/dL (ref 6–20)
CO2: 15 mmol/L — ABNORMAL LOW (ref 22–32)
CO2: 17 mmol/L — ABNORMAL LOW (ref 22–32)
CO2: 19 mmol/L — ABNORMAL LOW (ref 22–32)
Calcium: 9.1 mg/dL (ref 8.9–10.3)
Calcium: 9.4 mg/dL (ref 8.9–10.3)
Calcium: 8.9 mg/dL (ref 8.9–10.3)
Chloride: 105 mmol/L (ref 98–111)
Chloride: 107 mmol/L (ref 98–111)
Chloride: 107 mmol/L (ref 98–111)
Creatinine, Ser: 1.62 mg/dL — ABNORMAL HIGH (ref 0.61–1.24)
Creatinine, Ser: 1.43 mg/dL — ABNORMAL HIGH (ref 0.61–1.24)
Creatinine, Ser: 1.18 mg/dL (ref 0.61–1.24)
GFR calc Af Amer: >60 mL/min (ref >60)
GFR calc Af Amer: >60 mL/min (ref >60)
GFR calc Af Amer: >60 mL/min (ref >60)
GFR calc non Af Amer: 55 mL/min — ABNORMAL LOW (ref >60)
GFR calc non Af Amer: >60 mL/min (ref >60)
GFR calc non Af Amer: >60 mL/min (ref >60)
Glucose, Bld: 187 mg/dL — ABNORMAL HIGH (ref 70–99)
Glucose, Bld: 133 mg/dL — ABNORMAL HIGH (ref 70–99)
Glucose, Bld: 139 mg/dL — ABNORMAL HIGH (ref 70–99)
Potassium: 4.3 mmol/L (ref 3.5–5.1)
Potassium: 3.8 mmol/L (ref 3.5–5.1)
Potassium: 3.6 mmol/L (ref 3.5–5.1)
Sodium: 135 mmol/L (ref 135–145)
Sodium: 136 mmol/L (ref 135–145)
Sodium: 134 mmol/L — ABNORMAL LOW (ref 135–145)

## 2018-12-25 LAB — GLUCOSE, CAPILLARY
Glucose-Capillary: 123 mg/dL — ABNORMAL HIGH (ref 70–99)
Glucose-Capillary: 134 mg/dL — ABNORMAL HIGH (ref 70–99)
Glucose-Capillary: 144 mg/dL — ABNORMAL HIGH (ref 70–99)
Glucose-Capillary: 179 mg/dL — ABNORMAL HIGH (ref 70–99)
Glucose-Capillary: 204 mg/dL — ABNORMAL HIGH (ref 70–99)
Glucose-Capillary: 206 mg/dL — ABNORMAL HIGH (ref 70–99)
Glucose-Capillary: 335 mg/dL — ABNORMAL HIGH (ref 70–99)
Glucose-Capillary: 350 mg/dL — ABNORMAL HIGH (ref 70–99)
Glucose-Capillary: 378 mg/dL — ABNORMAL HIGH (ref 70–99)

## 2018-12-25 LAB — HIV ANTIBODY (ROUTINE TESTING W REFLEX): HIV Screen 4th Generation wRfx: NONREACTIVE

## 2018-12-25 MED ORDER — INSULIN GLARGINE 100 UNIT/ML ~~LOC~~ SOLN
20.0000 [IU] | Freq: Every day | SUBCUTANEOUS | Status: DC
  Administered 2018-12-25: 10:00:00 20 [IU] via SUBCUTANEOUS
  Filled 2018-12-25 (×2): qty 0.2

## 2018-12-25 MED ORDER — LIVING WELL WITH DIABETES BOOK
Freq: Once | Status: AC
  Administered 2018-12-25: 17:00:00
  Filled 2018-12-25: qty 1

## 2018-12-25 MED ORDER — INSULIN ASPART 100 UNIT/ML ~~LOC~~ SOLN
0.0000 [IU] | Freq: Three times a day (TID) | SUBCUTANEOUS | Status: DC
  Administered 2018-12-25 (×2): 7 [IU] via SUBCUTANEOUS
  Administered 2018-12-26: 9 [IU] via SUBCUTANEOUS
  Administered 2018-12-26: 7 [IU] via SUBCUTANEOUS
  Administered 2018-12-26: 9 [IU] via SUBCUTANEOUS
  Administered 2018-12-27: 2 [IU] via SUBCUTANEOUS
  Administered 2018-12-27: 7 [IU] via SUBCUTANEOUS

## 2018-12-25 MED ORDER — INSULIN STARTER KIT- PEN NEEDLES (ENGLISH)
1.0000 | Freq: Once | Status: AC
  Administered 2018-12-25: 1
  Filled 2018-12-25: qty 1

## 2018-12-25 MED ORDER — INSULIN ASPART 100 UNIT/ML ~~LOC~~ SOLN
5.0000 [IU] | Freq: Once | SUBCUTANEOUS | Status: AC
  Administered 2018-12-25: 5 [IU] via SUBCUTANEOUS

## 2018-12-25 NOTE — Progress Notes (Signed)
PROGRESS NOTE    Grant [name]  LHT:[id number] DOB: 04-01-1986 DOA: 12/24/2018 PCP: Renford Dills, MD  Brief Narrative: 33 year old male with history of autism, asthma, narcolepsy, glaucoma was brought to the emergency room by family with excessive hiccups abdominal pain nausea vomiting.  In the ED he was noted to have a CBG of 440 with anion gap of 23 and bicarb of 8, he was diagnosed with diabetic ketoacidosis and mild acute kidney injury. -Started on a glucose stabilizer and IV fluids   Assessment & Plan:   Diabetic ketoacidosis/new diabetes mellitus -Anion gap has corrected will transition off insulin drip to Lantus and sliding scale -Resume diet -Needs insulin teaching, this will be difficult given patient's history of autism, cognitive disability, will need to educate patient's mother regarding insulin administration -Diabetic coordinator consulted -Follow-up hemoglobin A1c  Developmental delay/Autism -stable  Moderate persistent asthma -Stable, nebs as needed  Narcolepsy -Continue modafinil  DVT prophylaxis: Lovenox Code Status: Full code Family Communication:  No family at bedside, called and updated mother Disposition Plan: Home   Procedures:   Antimicrobials:    Subjective: -feels better, anxious to eat, -Poor historian, very pleasant  Objective: Vitals:   12/24/18 2359 12/25/18 0623 12/25/18 0822 12/25/18 0938  BP: 125/72 119/82  (!) 143/94  Pulse: (!) 106 84  90  Resp: 19 14  20   Temp: (!) 97.4 F (36.3 C) 98 F (36.7 C)  (!) 97.3 F (36.3 C)  TempSrc: Oral Oral  Oral  SpO2: 100% 96% 99% 94%  Weight:      Height:        Intake/Output Summary (Last 24 hours) at 12/25/2018 1118 Last data filed at 12/24/2018 2322 Gross per 24 hour  Intake 950 ml  Output 300 ml  Net 650 ml   Filed Weights   12/24/18 1341 12/24/18 2043  Weight: 74.8 kg 66.6 kg    Examination:  General exam: Appears calm and comfortable, pleasant awake alert oriented  to self and place only Respiratory system: Clear to auscultation. Respiratory effort normal. Cardiovascular system: S1 & S2 heard, RRR. Gastrointestinal system: Abdomen is nondistended, soft and nontender.Normal bowel sounds heard. Central nervous system:  No focal neurological deficits, cognitive delay noted Extremities: No edema Skin: No rashes, lesions or ulcers Psychiatry: Mood & affect appropriate.     Data Reviewed:   CBC: Recent Labs  Lab 12/24/18 1533 12/24/18 1540 12/24/18 2053  WBC  --  16.0* 16.8*  NEUTROABS  --  13.6*  --   HGB 20.4* 20.3* 20.0*  HCT 60.0* 60.1* 58.5*  MCV  --  84.4 83.7  PLT  --  286 295   Basic Metabolic Panel: Recent Labs  Lab 12/24/18 1648 12/24/18 2053 12/25/18 0036 12/25/18 0409 12/25/18 0757  NA 131* 133* 135 136 134*  K 5.2* 4.3 4.3 3.8 3.6  CL 98 102 105 107 107  CO2 <7* 8* 15* 17* 19*  GLUCOSE 449* 293* 187* 133* 139*  BUN 24* 23* 20 18 17   CREATININE 1.94* 1.80* 1.62* 1.43* 1.18  CALCIUM 9.2 9.0 9.1 9.4 8.9   GFR: Estimated Creatinine Clearance: 84 mL/min (by C-G formula based on SCr of 1.18 mg/dL). Liver Function Tests: Recent Labs  Lab 12/24/18 1648  AST 16  ALT 27  ALKPHOS 99  BILITOT 1.6*  PROT 8.7*  ALBUMIN 4.5   Recent Labs  Lab 12/24/18 1648  LIPASE 39   No results for input(s): AMMONIA in the last 168 hours. Coagulation Profile: No results for  input(s): INR, PROTIME in the last 168 hours. Cardiac Enzymes: No results for input(s): CKTOTAL, CKMB, CKMBINDEX, TROPONINI in the last 168 hours. BNP (last 3 results) No results for input(s): PROBNP in the last 8760 hours. HbA1C: No results for input(s): HGBA1C in the last 72 hours. CBG: Recent Labs  Lab 12/25/18 0132 12/25/18 0233 12/25/18 0354 12/25/18 0532 12/25/18 0633  GLUCAP 206* 179* 134* 123* 144*   Lipid Profile: No results for input(s): CHOL, HDL, LDLCALC, TRIG, CHOLHDL, LDLDIRECT in the last 72 hours. Thyroid Function Tests: No results  for input(s): TSH, T4TOTAL, FREET4, T3FREE, THYROIDAB in the last 72 hours. Anemia Panel: No results for input(s): VITAMINB12, FOLATE, FERRITIN, TIBC, IRON, RETICCTPCT in the last 72 hours. Urine analysis:    Component Value Date/Time   COLORURINE YELLOW 12/24/2018 1424   APPEARANCEUR CLEAR 12/24/2018 1424   LABSPEC 1.021 12/24/2018 1424   PHURINE 5.0 12/24/2018 1424   GLUCOSEU >=500 (A) 12/24/2018 1424   HGBUR MODERATE (A) 12/24/2018 1424   BILIRUBINUR NEGATIVE 12/24/2018 1424   KETONESUR 80 (A) 12/24/2018 1424   PROTEINUR 100 (A) 12/24/2018 1424   NITRITE NEGATIVE 12/24/2018 1424   LEUKOCYTESUR NEGATIVE 12/24/2018 1424   Sepsis Labs: @LABRCNTIP (procalcitonin:4,lacticidven:4)  ) Recent Results (from the past 240 hour(s))  SARS Coronavirus 2 [facility](Hospital order, Performed in [facility] lab)     Status: None   Collection Time: 12/24/18  1:57 PM  Result Value Ref Range Status   SARS Coronavirus 2 NEGATIVE NEGATIVE Final    Comment: (NOTE) If result is NEGATIVE SARS-CoV-2 target nucleic acids are NOT DETECTED. The SARS-CoV-2 RNA is generally detectable in upper and lower  respiratory specimens during the acute phase of infection. The lowest  concentration of SARS-CoV-2 viral copies this assay can detect is 250  copies / mL. A negative result does not preclude SARS-CoV-2 infection  and should not be used as the sole basis for treatment or other  patient management decisions.  A negative result may occur with  improper specimen collection / handling, submission of specimen other  than nasopharyngeal swab, presence of viral mutation(s) within the  areas targeted by this assay, and inadequate number of viral copies  (<250 copies / mL). A negative result must be combined with clinical  observations, patient history, and epidemiological information. If result is POSITIVE SARS-CoV-2 target nucleic acids are DETECTED. The SARS-CoV-2 RNA is generally detectable in upper and lower   respiratory specimens dur ing the acute phase of infection.  Positive  results are indicative of active infection with SARS-CoV-2.  Clinical  correlation with patient history and other diagnostic information is  necessary to determine patient infection status.  Positive results do  not rule out bacterial infection or co-infection with other viruses. If result is PRESUMPTIVE POSTIVE SARS-CoV-2 nucleic acids MAY BE PRESENT.   A presumptive positive result was obtained on the submitted specimen  and confirmed on repeat testing.  While 2019 novel coronavirus  (SARS-CoV-2) nucleic acids may be present in the submitted sample  additional confirmatory testing may be necessary for epidemiological  and / or clinical management purposes  to differentiate between  SARS-CoV-2 and other Sarbecovirus currently known to infect humans.  If clinically indicated additional testing with an alternate test  methodology [phone]([id number]) is advised. The SARS-CoV-2 RNA is generally  detectable in upper and lower respiratory sp ecimens during the acute  phase of infection. The expected result is Negative. Fact Sheet for Patients:  BoilerBrush.com.cyhttps://www.fda.gov/media/136312/download Fact Sheet for Healthcare Providers: https://pope.com/https://www.fda.gov/media/136313/download This test  is not yet approved or cleared by the [place] and has been authorized for detection and/or diagnosis of SARS-CoV-2 by FDA under an Emergency Use Authorization (EUA).  This EUA will remain in effect (meaning this test can be used) for the duration of the COVID-19 declaration under Section 564(b)(1) of the Act, 21 U.S.C. section 360bbb-3(b)(1), unless the authorization is terminated or revoked sooner. Performed at [facility], [address] [place], [place] [address]          Radiology Studies: Dg Chest [facility] 1 View  Result Date: 12/24/2018 CLINICAL DATA:  Short of breath EXAM: PORTABLE CHEST 1 VIEW COMPARISON:  None. FINDINGS: The  heart size and mediastinal contours are within normal limits. Both lungs are clear. The visualized skeletal structures are unremarkable. IMPRESSION: No active disease. Electronically Signed   By: Marlan Palau M.D.   On: 12/24/2018 13:57        Scheduled Meds: . enoxaparin (LOVENOX) injection  40 mg Subcutaneous Q24H  . insulin aspart  0-9 Units Subcutaneous TID WC  . insulin glargine  20 Units Subcutaneous Daily  . latanoprost  1 drop Both Eyes QHS  . living well with diabetes book   Does not apply Once  . modafinil  200 mg Oral 2 times per day  . mometasone-formoterol  2 puff Inhalation BID   Continuous Infusions: . sodium chloride 75 mL/hr at 12/25/18 0951     LOS: 1 day    Time spent:    Zannie Cove, MD [facility]  12/25/2018, 11:18 AM

## 2018-12-25 NOTE — Progress Notes (Signed)
Pt blood sugar 336. Pt instructed on giving himself the injection. Pt did very well with step by step direction and followed appropriately. Pt was able to inject himself. Tolerated well.  Lacy Duverney, RN

## 2018-12-25 NOTE — Progress Notes (Signed)
Pt provided education on insulin and how to administer into abdomen. Pt was receptive to learning. Will help pt with administration @ 4pm blood sugar check. Will continue to educate regarding diabetes. Spoke with mother and advised upon pt discharge instruction/education for administration would be provided.  Lacy Duverney, RN

## 2018-12-25 NOTE — TOC Benefit Eligibility Note (Signed)
Transition of Care [facility]) Benefit Eligibility Note    Patient Details  Name: Grant [name] MRN: [id number] Date of Birth: [birth year]   Medication/Dose: , P/A=NO(NOVOLOG PEN-NON -Mervyn Gay , P/A YES [phone] , ALTERNATIVE: HUMALOG COVER- YES, COPAY-$3.90 , TIER-3 DRUG)  Covered?: Yes  Tier: 3 Drug  Prescription Coverage [facility]erred Pharmacy[facility])  Spoke with Person/Company/Phone Number:: [employer] #  [phone]   PRIMARY INS)  Co-Pay: $3.90  Prior Approval: No  Deductible: Met  Additional Notes: SECONDARY INS : MEDICAID [place] ACCESS , EFF-DATE: 06-13-2017, CO-PAY- $3.90 FOR EACH PRESCRIPTION    Memory Argue Phone Number: 12/25/2018, 2:00 PM

## 2018-12-25 NOTE — Progress Notes (Signed)
Spoke w/ pt this afternoon.  Pt with cognitive disability and will need assistance with giving insulin and caring for diabetes at home.  DM Coordinator Enrique Sack called pt's Mom earlier today and discussed new diagnosis.  Attempted to show pt how to use Insulin pen.  Pt was able to manipulate the insulin pen, however, needed much assistance and prompting.  I am not sure pt will be able to give his insulin injections independently.  Will likely need assistance from his parents at home.  RN Boneta Lucks working closely with pt today.  Pt able to administer insulin injection with insulin syringe correctly into abdomen.  RN to continue to work with patient and family (may need to use Ipad to communicate with family at home).  I will attempt to call pt's Mom and see if she has an iphone that we can Facetime today so I can show her the insulin pen as well and then let her and the patient decide which method (pen or vial and syringe) will work best for them at home (Per DM Coordinator note from earlier, pt's Mom has given insulin injections to her father in the past--I assume with vial and syringe).    --Will follow patient during hospitalization--  Ambrose Finland RN, MSN, CDE Diabetes Coordinator Inpatient Glycemic Control Team Team Pager: [phone] (8a-5p)

## 2018-12-25 NOTE — Progress Notes (Addendum)
Spoke with patient's mother on the phone. She was very receptive to the call and appreciated information.   Mother states that her sister, a travel nurse,was diagnosed last year with diabetes and was on insulin for a period of time. Mother states she had given insulin to her father in the past and had gestational diabetes herself. The patient does need help with his medications and the parents are able to do this for him. He could probably give his own injection. The mother was interested in the CGM (continuous glucose monitor) for blood sugar checks, but would need to see if insurance would cover. Would need to get prescription from PCP after discharge.  Patient does have a job at [employer], but of course is not working at this time. His mother is working from home at this time.   Ordered Living Well with Diabetes booklet for patient, ordered consult for dietician and case manager to check on which insulin would be covered by his insurance. Insulin pens would be a preference, if possible. Patient and family may benefit from instruction at the [facility] after discharge. Would need physician referral. Will continue to monitor blood sugars while in the hospital. Staff RNs will work with patient on giving injections.  Smith Mince RN BSN CDE Diabetes Coordinator Pager: [phone]  8am-5pm

## 2018-12-26 LAB — CBC
HCT: 44 % (ref 39.0–52.0)
Hemoglobin: 15.3 g/dL (ref 13.0–17.0)
MCH: 28.6 pg (ref 26.0–34.0)
MCHC: 34.8 g/dL (ref 30.0–36.0)
MCV: 82.2 fL (ref 80.0–100.0)
Platelets: 202 10*3/uL (ref 150–400)
RBC: 5.35 MIL/uL (ref 4.22–5.81)
RDW: 11.6 % (ref 11.5–15.5)
WBC: 8.2 10*3/uL (ref 4.0–10.5)
nRBC: 0 % (ref 0.0–0.2)

## 2018-12-26 LAB — GLUCOSE, CAPILLARY
Glucose-Capillary: 301 mg/dL — ABNORMAL HIGH (ref 70–99)
Glucose-Capillary: 455 mg/dL — ABNORMAL HIGH (ref 70–99)
Glucose-Capillary: 359 mg/dL — ABNORMAL HIGH (ref 70–99)
Glucose-Capillary: 149 mg/dL — ABNORMAL HIGH (ref 70–99)

## 2018-12-26 LAB — BASIC METABOLIC PANEL
Anion gap: 11 (ref 5–15)
BUN: 13 mg/dL (ref 6–20)
CO2: 19 mmol/L — ABNORMAL LOW (ref 22–32)
Calcium: 8.3 mg/dL — ABNORMAL LOW (ref 8.9–10.3)
Chloride: 100 mmol/L (ref 98–111)
Creatinine, Ser: 1.16 mg/dL (ref 0.61–1.24)
GFR calc Af Amer: >60 mL/min (ref >60)
GFR calc non Af Amer: >60 mL/min (ref >60)
Glucose, Bld: 313 mg/dL — ABNORMAL HIGH (ref 70–99)
Potassium: 3.7 mmol/L (ref 3.5–5.1)
Sodium: 130 mmol/L — ABNORMAL LOW (ref 135–145)

## 2018-12-26 LAB — HEMOGLOBIN A1C
Hgb A1c MFr Bld: 10.8 % — ABNORMAL HIGH (ref 4.8–5.6)
Mean Plasma Glucose: 263.26 mg/dL

## 2018-12-26 MED ORDER — INSULIN ASPART 100 UNIT/ML ~~LOC~~ SOLN
3.0000 [IU] | Freq: Three times a day (TID) | SUBCUTANEOUS | Status: DC
  Administered 2018-12-26: 3 [IU] via SUBCUTANEOUS

## 2018-12-26 MED ORDER — INSULIN ASPART 100 UNIT/ML ~~LOC~~ SOLN
6.0000 [IU] | Freq: Three times a day (TID) | SUBCUTANEOUS | Status: DC
  Administered 2018-12-26 (×2): 6 [IU] via SUBCUTANEOUS

## 2018-12-26 MED ORDER — INSULIN GLARGINE 100 UNIT/ML ~~LOC~~ SOLN
35.0000 [IU] | Freq: Every day | SUBCUTANEOUS | Status: DC
  Administered 2018-12-26: 11:00:00 35 [IU] via SUBCUTANEOUS
  Filled 2018-12-26 (×2): qty 0.35

## 2018-12-26 NOTE — Progress Notes (Signed)
PROGRESS NOTE    LOGYN KENDRICK  NGE:[id number] DOB: [birth year] DOA: 12/24/2018 PCP: Renford Dills, MD  Brief Narrative: 33 year old male with history of autism, asthma, narcolepsy, glaucoma was brought to the emergency room by family with excessive hiccups abdominal pain nausea vomiting.  In the ED he was noted to have a CBG of 440 with anion gap of 23 and bicarb of 8, he was diagnosed with diabetic ketoacidosis and mild acute kidney injury. -Started on a glucose stabilizer and IV fluids   Assessment & Plan:   Diabetic ketoacidosis/new diabetes mellitus -Anion gap has corrected, transitioned off insulin drip to Lantus and sliding scale  -Diet resumed  -CBGs uncontrolled in the 300 range  -Will titrate up Lantus, add NovoLog pre-meal  -Continue insulin teaching, this is a bit challenging given patient's autism, cognitive disability, staff trying to work with mother as well -Diabetes coordinator input appreciated -Follow-up hemoglobin A1c, ordered again  Developmental delay/Autism -stable  Moderate persistent asthma -Stable, nebs as needed  Narcolepsy -Continue modafinil  DVT prophylaxis: Lovenox Code Status: Full code Family Communication:  No family at bedside, called and updated mother yesterday, home tomorrow if stable after adequate insulin teaching Disposition Plan: Home   Procedures:   Antimicrobials:    Subjective: -Looks better, no complaints, tolerating diet, attempting to learn insulin administration  Objective: Vitals:   12/25/18 2003 12/25/18 2032 12/26/18 0415 12/26/18 0822  BP: 131/84  111/73   Pulse: 90  76 82  Resp: Temp: 97.9 F (36.6 C)  97.9 F (36.6 C)   TempSrc: Oral  Oral   SpO2: 95% 95% 96% 97%  Weight:      Height:        Intake/Output Summary (Last 24 hours) at 12/26/2018 1016 Last data filed at 12/26/2018 0556 Gross per 24 hour  Intake 1323.74 ml  Output 2475 ml  Net -1151.26 ml   Filed Weights   12/24/18 1341  12/24/18 2043  Weight: 74.8 kg 66.6 kg    Examination:  Gen: Awake, Alert, Oriented X 2, pleasant no distress, mild cognitive impairment HEENT: PERRLA, Neck supple, no JVD Lungs: Good air movement bilaterally, CTAB CVS: RRR,No Gallops,Rubs or new Murmurs Abd: soft, Non tender, non distended, BS present Extremities: No edema Skin: no new rashes Psychiatry: Mood & affect appropriate.     Data Reviewed:   CBC: Recent Labs  Lab 12/24/18 1533 12/24/18 1540 12/24/18 2053 12/26/18 0509  WBC  --  16.0* 16.8* 8.2  NEUTROABS  --  13.6*  --   --   HGB 20.4* 20.3* 20.0* 15.3  HCT 60.0* 60.1* 58.5* 44.0  MCV  --  84.4 83.7 82.2  PLT  --  286 295 202   Basic Metabolic Panel: Recent Labs  Lab 12/24/18 2053 12/25/18 0036 12/25/18 0409 12/25/18 0757 12/26/18 0509  NA 133* 135 136 134* 130*  K 4.3 4.3 3.8 3.6 3.7  CL 102 105 107 107 100  CO2 8* 15* 17* 19* 19*  GLUCOSE 293* 187* 133* 139* 313*  BUN 23* CREATININE 1.80* 1.62* 1.43* 1.18 1.16  CALCIUM 9.0 9.1 9.4 8.9 8.3*   GFR: Estimated Creatinine Clearance: 85.5 mL/min (by C-G formula based on SCr of 1.16 mg/dL). Liver Function Tests: Recent Labs  Lab 12/24/18 1648  AST 16  ALT 27  ALKPHOS 99  BILITOT 1.6*  PROT 8.7*  ALBUMIN 4.5   Recent Labs  Lab 12/24/18 1648  LIPASE 39  No results for input(s): AMMONIA in the last 168 hours. Coagulation Profile: No results for input(s): INR, PROTIME in the last 168 hours. Cardiac Enzymes: No results for input(s): CKTOTAL, CKMB, CKMBINDEX, TROPONINI in the last 168 hours. BNP (last 3 results) No results for input(s): PROBNP in the last 8760 hours. HbA1C: No results for input(s): HGBA1C in the last 72 hours. CBG: Recent Labs  Lab 12/25/18 0633 12/25/18 1139 12/25/18 1722 12/25/18 2023 12/26/18 0622  GLUCAP 144* 350* 335* 378* 301*   Lipid Profile: No results for input(s): CHOL, HDL, LDLCALC, TRIG, CHOLHDL, LDLDIRECT in the last 72 hours. Thyroid  Function Tests: No results for input(s): TSH, T4TOTAL, FREET4, T3FREE, THYROIDAB in the last 72 hours. Anemia Panel: No results for input(s): VITAMINB12, FOLATE, FERRITIN, TIBC, IRON, RETICCTPCT in the last 72 hours. Urine analysis:    Component Value Date/Time   COLORURINE YELLOW 12/24/2018 1424   APPEARANCEUR CLEAR 12/24/2018 1424   LABSPEC 1.021 12/24/2018 1424   PHURINE 5.0 12/24/2018 1424   GLUCOSEU >=500 (A) 12/24/2018 1424   HGBUR MODERATE (A) 12/24/2018 1424   BILIRUBINUR NEGATIVE 12/24/2018 1424   KETONESUR 80 (A) 12/24/2018 1424   PROTEINUR 100 (A) 12/24/2018 1424   NITRITE NEGATIVE 12/24/2018 1424   LEUKOCYTESUR NEGATIVE 12/24/2018 1424   Sepsis Labs: @LABRCNTIP (procalcitonin:4,lacticidven:4)  ) Recent Results (from the past 240 hour(s))  SARS Coronavirus 2 [facility] order, Performed in [facility] hospital lab)     Status: None   Collection Time: 12/24/18  1:57 PM  Result Value Ref Range Status   SARS Coronavirus 2 NEGATIVE NEGATIVE Final    Comment: (NOTE) If result is NEGATIVE SARS-CoV-2 target nucleic acids are NOT DETECTED. The SARS-CoV-2 RNA is generally detectable in upper and lower  respiratory specimens during the acute phase of infection. The lowest  concentration of SARS-CoV-2 viral copies this assay can detect is 250  copies / mL. A negative result does not preclude SARS-CoV-2 infection  and should not be used as the sole basis for treatment or other  patient management decisions.  A negative result may occur with  improper specimen collection / handling, submission of specimen other  than nasopharyngeal swab, presence of viral mutation(s) within the  areas targeted by this assay, and inadequate number of viral copies  (<250 copies / mL). A negative result must be combined with clinical  observations, patient history, and epidemiological information. If result is POSITIVE SARS-CoV-2 target nucleic acids are DETECTED. The SARS-CoV-2 RNA is generally  detectable in upper and lower  respiratory specimens dur ing the acute phase of infection.  Positive  results are indicative of active infection with SARS-CoV-2.  Clinical  correlation with patient history and other diagnostic information is  necessary to determine patient infection status.  Positive results do  not rule out bacterial infection or co-infection with other viruses. If result is PRESUMPTIVE POSTIVE SARS-CoV-2 nucleic acids MAY BE PRESENT.   A presumptive positive result was obtained on the submitted specimen  and confirmed on repeat testing.  While 2019 novel coronavirus  (SARS-CoV-2) nucleic acids may be present in the submitted sample  additional confirmatory testing may be necessary for epidemiological  and / or clinical management purposes  to differentiate between  SARS-CoV-2 and other Sarbecovirus currently known to infect humans.  If clinically indicated additional testing with an alternate test  methodology [phone]) is advised. The SARS-CoV-2 RNA is generally  detectable in upper and lower respiratory sp ecimens during the acute  phase of infection. The expected result is  Negative. Fact Sheet for Patients:  BoilerBrush.com.cyhttps://www.fda.gov/media/136312/download Fact Sheet for Healthcare Providers: https://pope.com/https://www.fda.gov/media/136313/download This test is not yet approved or cleared by the [place] FDA and has been authorized for detection and/or diagnosis of SARS-CoV-2 by FDA under an Emergency Use Authorization (EUA).  This EUA will remain in effect (meaning this test can be used) for the duration of the COVID-19 declaration under Section 564(b)(1) of the Act, 21 U.S.C. section 360bbb-3(b)(1), unless the authorization is terminated or revoked sooner. Performed at [facility], [address] [place], [place] [address]          Radiology Studies: [facility]  Result Date: 12/24/2018 CLINICAL DATA:  Short of breath EXAM: PORTABLE CHEST 1 VIEW  COMPARISON:  None. FINDINGS: The heart size and mediastinal contours are within normal limits. Both lungs are clear. The visualized skeletal structures are unremarkable. IMPRESSION: No active disease. Electronically Signed   By: Marlan Palauharles  Clark M.D.   On: 12/24/2018 13:57        Scheduled Meds: . enoxaparin (LOVENOX) injection  40 mg Subcutaneous Q24H  . insulin aspart  0-9 Units Subcutaneous TID WC  . insulin aspart  3 Units Subcutaneous TID WC  . insulin glargine  35 Units Subcutaneous Daily  . latanoprost  1 drop Both Eyes QHS  . modafinil  200 mg Oral 2 times per day  . mometasone-formoterol  2 puff Inhalation BID   Continuous Infusions:    LOS: 2 days    Time spent: 25min    Zannie CovePreetha Kimbria Camposano, MD [facility]  12/26/2018, 10:16 AM

## 2018-12-26 NOTE — Progress Notes (Signed)
Per insurance check for insulin coverage  # 4.  S/W [employer] @ [employer] # [phone]   PRIMARY INS :  1. LANTUS PEN  COVER- YES  CO-PAY- $ 3.90  TIER - 3 DRUG  PRIOR APPROVAL- NO   2. NOVOLOG PEN  COVER- NOT COVER  PRIOR APPROVAL- YES # [id number]   ALTERNATIIVE:  1. HUMALOG  COVER- YES  CO-PAY- $3.90  TIER- 3 DRUG  PRIOR APPROVAL- NO   [facility]ERRED PHARMACY : [facility]   SECONDARY INS :  MEDICAID [place] ACCESS  EFF-DATE 06-13-2017  CO-PAY- $3.90 FOR EACH PRESCRIPTION

## 2018-12-26 NOTE — Progress Notes (Signed)
Pt CBG=378. Pt is new diabetic. Pt does not have order for HS insulin coverage. Text paged NP. Given order for 5U Novolog. Pt denies any hyperglycemic symptoms. Will continue to monitor. Victorino December, RN

## 2018-12-26 NOTE — Progress Notes (Signed)
Inpatient Diabetes Program Recommendations  AACE/ADA: New Consensus Statement on Inpatient Glycemic Control (2015)  Target Ranges:  Prepandial:   less than 140 mg/dL      Peak postprandial:   less than 180 mg/dL (1-2 hours)      Critically ill patients:  140 - 180 mg/dL   Lab Results  Component Value Date   GLUCAP 301 (H) 12/26/2018    Review of Glycemic Control  Diabetes history: New diagnosis  Current orders for Inpatient glycemic control:  Lantus 35 units Daily Novolog 0-9 units tid Novolog 6 units tid with meals  Inpatient Diabetes Program Recommendations:     Spoke with patient's mom over the phone to ask if vial and syringe versus insulin pen would be more appropriate at time of d/c. Patient has been shown insulin pen by staff and also takes allergy shots Q month via SQ pen administration. Pateint has been successfully giving himslef in jections and checking glucose.   Patient's Mom was upset saying his glucose was in the 100's and now in the 300's. Explained when we transitioned from IV to SQ therapy we start at lower doses and then increase if needed. Explained to Mom the dose was increased today.  Mom also upset that patient had chocolate milk and frosted flakes from breakfast and stated " this is not for diabetics."  Called dietary and carb modified list for allowable items were recently changed and that DM patient's can have up to 80 grams of carbs.  Noted glucose 455 at lunch. After chocolate milk, 1% milk, frosted flakes breakfast. RN called MD for more meal coverage. Lantus dose should start working within the next 1-2 hours.  Will monitor trends.  Thanks,  Christena Deem RN, MSN, BC-ADM Inpatient Diabetes Coordinator Team Pager [phone] (8a-5p)

## 2018-12-26 NOTE — Plan of Care (Signed)
  RD consulted for nutrition education regarding diabetes.   Lab Results  Component Value Date   HGBA1C 10.8 (H) 12/26/2018    RD provided "Carbohydrate Counting for People with Diabetes" handout from the Academy of Nutrition and Dietetics in D/C instructions. Discussed different food groups and their effects on blood sugar, emphasizing carbohydrate-containing foods. Provided list of carbohydrates and recommended serving sizes of common foods.  Discussed importance of controlled and consistent carbohydrate intake throughout the day. Provided examples of ways to balance meals/snacks and encouraged intake of high-fiber, whole grain complex carbohydrates. Teach back method used.  Spoke with mother via phone. Pt eats three meals daily and drinks low calorie juice occasionally. Mother reports having a long history of DM in the family. She is familiar with foods that contain carbohydrates and how to read a food label. She does all of the cooking for the pt. Discussed having three balanced meals with snacks daily, amount of carbohydrates that are recommended at each meal, and drink substitutes she could provide.   Expect fair compliance.  Body mass index is 23 kg/m. Pt meets criteria for normal based on current BMI.  Current diet order is CM, patient is consuming approximately 100% of meals at this time. Labs and medications reviewed. No further nutrition interventions warranted at this time. RD contact information provided. If additional nutrition issues arise, please re-consult RD.  Vanessa Kick RD, LDN Clinical Nutrition Pager # [phone]

## 2018-12-26 NOTE — Discharge Instructions (Addendum)
Carbohydrate Counting for People with Diabetes ° °Why Is Carbohydrate Counting Important? °Counting carbohydrate servings may help you control your blood glucose level so that you feel better. °The balance between the carbohydrates you eat and insulin determines what your blood glucose level will be after °eating. °Carbohydrate counting can also help you plan your meals. ° °Which Foods Have Carbohydrates? °Foods with carbohydrates include: °Breads, crackers, and cereals °Pasta, rice, and grains °Starchy vegetables, such as potatoes, corn, and peas °Beans and legumes °Milk, soy milk, and yogurt °Fruits and fruit juices °Sweets, such as cakes, cookies, ice cream, jam, and jelly ° °Carbohydrate Servings °In diabetes meal planning, 1 serving of a food with carbohydrate has about 15 grams of carbohydrate: °Check serving sizes with measuring cups and spoons or a food scale. °Read the Nutrition Facts on food labels to find out how many grams of carbohydrate are in foods you eat. °The food lists in this handout show portions that have about 15 grams of carbohydrate. ° °Tips °Meal Planning Tips °An Eating Plan tells you how many carbohydrate servings to eat at your meals and snacks. For many adults, eating 3 °to 5 servings of carbohydrate foods at each meal and 1 or 2 carbohydrate servings for each snack works well. °In a healthy daily Eating Plan, most carbohydrates come from: °At least 6 servings of fruits and nonstarchy vegetables °At least 6 servings of grains, beans, and starchy vegetables, with at least 3 servings from whole grains °At least 2 servings of milk or milk products °Check your blood glucose level regularly. It can tell you if you need to adjust when you eat carbohydrates. °Eating foods that have fiber, such as whole grains, and having very few salty foods is good for your health. °Eat 4 to 6 ounces of meat or other protein foods (such as soybean burgers) each day. Choose low-fat sources of °protein, such as  lean beef, lean pork, chicken, fish, low-fat cheese, or vegetarian foods such as soy. °Eat some healthy fats, such as olive oil, canola oil, and nuts. °Eat very little saturated fats. These unhealthy fats are found in butter, cream, and high-fat meats, such as bacon and °sausage. °Eat very little or no trans fats. These unhealthy fats are found in all foods that list “partially hydrogenated oil” as an °Ingredient. ° °Label Reading Tips °The Nutrition Facts panel on a label lists the grams of total carbohydrate in 1 standard serving. The label’s standard serving °may be larger or smaller than 1 carbohydrate serving. °To figure out how many carbohydrate servings are in the food: °First, look at the label’s standard serving size. °Check the grams of total carbohydrate. This is the amount of carbohydrate in 1 standard serving. °Divide the grams of total carbohydrate by 15. This number equals the number of carbohydrate servings in 1 standard °serving. Remember: 1 carbohydrate serving is 15 grams of carbohydrate. °Note: You may ignore the grams of sugars on the Nutrition Facts panel because they are included in the grams of °total carbohydrate. ° °Foods Recommended °1 serving = about 15 grams of carbohydrate ° °Starches °1 slice bread (1 ounce) °1 tortilla (6-inch size) °¼ large bagel (1 ounce) °2 taco shells (5-inch size) °½ hamburger or hot dog bun (¾ ounce) °¾ cup ready-to-eat unsweetened cereal °½ cup cooked cereal °1 cup broth-based soup °4 to 6 small crackers °1/3 cup pasta or rice (cooked) °½ cup beans, peas, corn, sweet potatoes, winter squash, or mashed or boiled potatoes (cooked) °¼ large baked potato (  3 ounces)  ounce pretzels, potato chips, or tortilla chips 3 cups popcorn (popped) Fruit 1 small fresh fruit ( to 1 cup)  cup canned or frozen fruit 2 tablespoons dried fruit (blueberries, cherries, cranberries, mixed fruit, raisins) 17 small grapes (3 ounces) 1 cup melon or berries  cup unsweetened  fruit juice  Milk 1 cup fat-free or reduced-fat milk 1 cup soy milk 2/3 cup (6 ounces) nonfat yogurt sweetened with sugar-free sweetener  Sweets and Desserts 2-inch square cake (unfrosted) 2 small cookies (2/3 ounce)  cup ice cream or frozen yogurt  cup sherbet or sorbet 1 tablespoon syrup, jam, jelly, table sugar, or honey 2 tablespoons light syrup  Other Foods Count 1 cup raw vegetables or  cup cooked nonstarchy vegetables as zero (0) carbohydrate servings or free foods. If you eat 3 or more servings at one meal, count them as 1 carbohydrate serving. Foods that have less than 20 calories in each serving also may be counted as zero carbohydrate servings or free foods. Count 1 cup of casserole or other mixed foods as 2 carbohydrate servings.  Carbohydrate Counting for People with Diabetes Sample 1-Day Menu  Breakfast 1 extra-small banana (1 carbohydrate serving) 3/4 cup corn flakes (1 carbohydrate serving) 1 cup low-fat or fat-free milk (1 carbohydrate serving) 1 slice whole wheat bread (1 carbohydrate serving) 1 teaspoon margarine  Lunch 2 ounces [place] slices 2 slices whole wheat bread (2 carbohydrate servings) 2 lettuce leaves 4 celery sticks 4 carrot sticks 1 medium apple (1 carbohydrate serving) 1 cup low-fat or fat-free milk (1 carbohydrate serving)  Afternoon Snack 2 tablespoons raisins (1 carbohydrate serving) 3/4 ounce unsalted mini pretzels (1 carbohydrate serving)  Evening Meal 3 ounces lean roast beef 1/2 large baked potato (2 carbohydrate servings) 1 tablespoon reduced-fat sour cream 1/2 cup green beans 1 cup vegetable salad 1 tablespoon light salad dressing 1 whole wheat dinner roll (1 carbohydrate serving) 1 teaspoon margarine 1 cup melon balls (1 carbohydrate serving)  Evening Snack 6 ounces low-fat sugar-free fruit yogurt (1 carbohydrate serving) 2 tablespoons unsalted nuts  Carbohydrate Counting for People with Diabetes Vegan Sample  1-Day Menu  Breakfast 1 cup cooked oatmeal (2 carbohydrate servings)  cup blueberries (1 carbohydrate serving) 2 tablespoons flaxseeds 1 cup soymilk fortified with calcium and vitamin D 1 cup coffee  Lunch 2 slices whole wheat bread (2 carbohydrate servings)  cup baked tofu  cup lettuce 2 slices tomato 2 slices avocado  cup baby carrots 1 orange (1 carbohydrate serving) 1 cup soymilk fortified with calcium and vitamin D  Evening Meal Burrito made with: 1 6-inch corn tortilla (1 carbohydrate serving) 1 cup refried vegetarian beans (1 carbohydrate serving)  cup chopped tomatoes  cup lettuce  cup salsa 1/3 cup brown rice (1 carbohydrate serving) 1 tablespoon olive oil for rice  cup zucchini  Evening Snack 6 small whole grain crackers (1 carbohydrate serving) 2 apricots ( carbohydrate serving)  cup unsalted peanuts ( carbohydrate serving)  Carbohydrate Counting for People with Diabetes Vegetarian (Lacto-Ovo) Sample 1-Day Menu  Breakfast 1 cup cooked oatmeal (2 carbohydrate servings)  cup blueberries (1 carbohydrate serving) 2 tablespoons flaxseeds 1 egg 1 cup 1% milk (1 carbohydrate serving) 1 cup coffee  Lunch 2 slices whole wheat bread (2 carbohydrate servings) 2 ounces low-fat cheese  cup lettuce 2 slices tomato 2 slices avocado  cup baby carrots 1 orange (1 carbohydrate serving) 1 cup unsweetened tea  Evening Meal Burrito made with: 1 6-inch corn tortilla (1 carbohydrate serving)  cup  vegetarian beans (1 carbohydrate serving) °¼ cup tomatoes °¼ cup lettuce °¼ cup salsa °1/3 cup brown rice (1 carbohydrate serving) °1 tablespoon olive oil for rice °½ cup zucchini °1 cup 1% milk (1 carbohydrate serving) ° °Evening Snack °6 small whole grain crackers (1 carbohydrate serving) °2 apricots (½ carbohydrate serving) °¼ cup unsalted peanuts (½ carbohydrate serving) °

## 2018-12-26 NOTE — Progress Notes (Signed)
Pt CBG=301 this morning. Pt SSI was 7 units. Pt guided through injecting himself with the insulin. Pt cleaned area well and injected insulin into his right abdomen. Pt takes instruction well but will need continued guidance to perform this task at home. Will continue to monitor. Victorino December, RN

## 2018-12-27 ENCOUNTER — Encounter (HOSPITAL_COMMUNITY): Payer: Self-pay

## 2018-12-27 DIAGNOSIS — G47419 Narcolepsy without cataplexy: Secondary | ICD-10-CM

## 2018-12-27 LAB — BASIC METABOLIC PANEL
Anion gap: 8 (ref 5–15)
BUN: 11 mg/dL (ref 6–20)
CO2: 25 mmol/L (ref 22–32)
Calcium: 8.7 mg/dL — ABNORMAL LOW (ref 8.9–10.3)
Chloride: 103 mmol/L (ref 98–111)
Creatinine, Ser: 0.93 mg/dL (ref 0.61–1.24)
GFR calc Af Amer: >60 mL/min (ref >60)
GFR calc non Af Amer: >60 mL/min (ref >60)
Glucose, Bld: 149 mg/dL — ABNORMAL HIGH (ref 70–99)
Potassium: 3 mmol/L — ABNORMAL LOW (ref 3.5–5.1)
Sodium: 136 mmol/L (ref 135–145)

## 2018-12-27 LAB — GLUCOSE, CAPILLARY
Glucose-Capillary: 154 mg/dL — ABNORMAL HIGH (ref 70–99)
Glucose-Capillary: 320 mg/dL — ABNORMAL HIGH (ref 70–99)

## 2018-12-27 MED ORDER — INSULIN PEN NEEDLE 32G X 4 MM MISC: 1.0000 | Freq: Every day | 1 refills | Status: AC

## 2018-12-27 MED ORDER — INSULIN GLARGINE 100 UNITS/ML SOLOSTAR PEN: 40.0000 [IU] | PEN_INJECTOR | Freq: Every day | SUBCUTANEOUS | 1 refills | Status: AC

## 2018-12-27 MED ORDER — BLOOD GLUCOSE METER KIT: PACK | 0 refills | Status: AC

## 2018-12-27 MED ORDER — INSULIN GLARGINE 100 UNIT/ML ~~LOC~~ SOLN
40.0000 [IU] | Freq: Every day | SUBCUTANEOUS | Status: DC
  Administered 2018-12-27: 40 [IU] via SUBCUTANEOUS
  Filled 2018-12-27: qty 0.4

## 2018-12-27 MED ORDER — POTASSIUM CHLORIDE CRYS ER 20 MEQ PO TBCR
40.0000 meq | EXTENDED_RELEASE_TABLET | Freq: Once | ORAL | Status: AC
  Administered 2018-12-27: 40 meq via ORAL
  Filled 2018-12-27: qty 2

## 2018-12-27 MED ORDER — INSULIN ASPART 100 UNIT/ML ~~LOC~~ SOLN
3.0000 [IU] | Freq: Three times a day (TID) | SUBCUTANEOUS | Status: DC
  Administered 2018-12-27: 3 [IU] via SUBCUTANEOUS

## 2018-12-27 NOTE — Progress Notes (Signed)
D/C instructions given to pt. New prescriptions for insulin discussed. New dietary habits discussed. All questions answered. IV removed, clean and intact. Mother to escort pt home.  Versie Starks, RN

## 2018-12-27 NOTE — Discharge Summary (Signed)
Physician Discharge Summary  Grant [name] OFB:[id number] DOB: May 26, 1986 DOA: 12/24/2018  PCP: Seward Carol, MD  Admit date: 12/24/2018 Discharge date: 12/27/2018  Time spent: 35 minutes  Recommendations for Outpatient Follow-up:  1. PCP Dr. Delfina Redwood in 1 week,  please titrate Lantus dose   Discharge Diagnoses:  Principal Problem:   DKA (diabetic ketoacidoses) ([place]) Active Problems:   Moderate persistent asthma in adult without complication   Idiopathic moderate intellectual disability   HTN (hypertension)   Discharge Condition: Stable  Diet recommendation: Diabetic  Filed Weights   12/24/18 1341 12/24/18 2043  Weight: 74.8 kg 66.6 kg    History of present illness:  33 year old male with history of autism, asthma, narcolepsy, glaucoma was brought to the emergency room by family with excessive hiccups abdominal pain nausea vomiting.  In the ED he was noted to have a CBG of 440 with anion gap of 23 and bicarb of 8, he was diagnosed with diabetic ketoacidosis and mild acute kidney injury  Hospital Course:   Diabetic ketoacidosis/new diabetes mellitus -Treated with insulin drip and aggressive IV fluids, anion gap has corrected, transitioned off insulin drip to Lantus and sliding scale  -Diet resumed  -Lantus dose was titrated up to 40 units at discharge, hemoglobin A1c was 10.8 -Patient was educated as well as diabetes coordinator and staff nurses tried to educate mother regarding insulin administration despite limitations with visitation and current COVID pandemic -We have decided to discharge him home on Lantus alone at 40 units daily, this will need to be titrated at follow-up -Educated the patient regarding importance of carb modified diet  Developmental delay/Autism -stable  Moderate persistent asthma -Stable, nebs as needed  Narcolepsy -Continue modafinil   Discharge Exam: Vitals:   12/27/18 0815 12/27/18 0858  BP:  132/86  Pulse:  96  Resp:     Temp:  97.6 F (36.4 C)  SpO2: 97% 99%    General: Alert awake oriented to self and place Cardiovascular: S1-S2/regular rate rhythm Respiratory: Clear  Discharge Instructions   Discharge Instructions    Diet Carb Modified   Complete by:  As directed    Increase activity slowly   Complete by:  As directed      Allergies as of 12/27/2018      Reactions   Other Anaphylaxis, Shortness Of Breath, Swelling   CANNOT HAVE ANY NUTS (in any form) Once had an allergic reaction so bad his nail beds turned blue   Peanut-containing Drug Products Anaphylaxis, Shortness Of Breath, Swelling      Medication List    TAKE these medications   albuterol 108 (90 Base) MCG/ACT inhaler Commonly known as:  ProAir HFA Inhale 2 puffs into the lungs every 6 (six) hours as needed. What changed:  reasons to take this   blood glucose meter kit and supplies Dispense based on patient and insurance preference. Use up to four times daily as directed. (FOR ICD-10 E10.9, E11.9).   Fluticasone-Salmeterol 250-50 MCG/DOSE Aepb Commonly known as:  Advair Diskus INHALE 1 PUFF INTO THE LUNGS 2 (TWO) TIMES DAILY. What changed:    how much to take  how to take this  when to take this  additional instructions   insulin glargine 100 unit/mL Sopn Commonly known as:  LANTUS Inject 0.4 mLs (40 Units total) into the skin daily.   Insulin Pen Needle 32G X 4 MM Misc 1 each by Does not apply route daily.   modafinil 200 MG tablet Commonly known as:  PROVIGIL TAKE 1  TABLET BY MOUTH IN  THE MORNING AND 1 TABLET  BETWEEN 12PM AND 3PM IN THE AFTERNOON What changed:    how much to take  how to take this  when to take this  additional instructions   Travoprost (BAK Free) 0.004 % Soln ophthalmic solution Commonly known as:  TRAVATAN Place 1 drop into both eyes at bedtime.      Allergies  Allergen Reactions  . Other Anaphylaxis, Shortness Of Breath and Swelling    CANNOT HAVE ANY NUTS (in any form)  Once had an allergic reaction so bad his nail beds turned blue  . Peanut-Containing Drug Products Anaphylaxis, Shortness Of Breath and Swelling   Follow-up Information    Polite, Ronald, MD. Schedule an appointment as soon as possible for a visit in 1 week(s).   Specialty:  Internal Medicine Contact information: [address] [place] [address] [phone]            The results of significant diagnostics from this hospitalization (including imaging, microbiology, ancillary and laboratory) are listed below for reference.    Significant Diagnostic Studies: Dg Chest Port 1 View  Result Date: 12/24/2018 CLINICAL DATA:  Short of breath EXAM: PORTABLE CHEST 1 VIEW COMPARISON:  None. FINDINGS: The heart size and mediastinal contours are within normal limits. Both lungs are clear. The visualized skeletal structures are unremarkable. IMPRESSION: No active disease. Electronically Signed   By: Franchot Gallo M.D.   On: 12/24/2018 13:57    Microbiology: Recent Results (from the past 240 hour(s))  SARS Coronavirus 2 [facility] order, Performed in [facility] hospital lab)     Status: None   Collection Time: 12/24/18  1:57 PM  Result Value Ref Range Status   SARS Coronavirus 2 NEGATIVE NEGATIVE Final    Comment: (NOTE) If result is NEGATIVE SARS-CoV-2 target nucleic acids are NOT DETECTED. The SARS-CoV-2 RNA is generally detectable in upper and lower  respiratory specimens during the acute phase of infection. The lowest  concentration of SARS-CoV-2 viral copies this assay can detect is 250  copies / mL. A negative result does not preclude SARS-CoV-2 infection  and should not be used as the sole basis for treatment or other  patient management decisions.  A negative result may occur with  improper specimen collection / handling, submission of specimen other  than nasopharyngeal swab, presence of viral mutation(s) within the  areas targeted by this assay, and inadequate  number of viral copies  (<250 copies / mL). A negative result must be combined with clinical  observations, patient history, and epidemiological information. If result is POSITIVE SARS-CoV-2 target nucleic acids are DETECTED. The SARS-CoV-2 RNA is generally detectable in upper and lower  respiratory specimens dur ing the acute phase of infection.  Positive  results are indicative of active infection with SARS-CoV-2.  Clinical  correlation with patient history and other diagnostic information is  necessary to determine patient infection status.  Positive results do  not rule out bacterial infection or co-infection with other viruses. If result is PRESUMPTIVE POSTIVE SARS-CoV-2 nucleic acids MAY BE PRESENT.   A presumptive positive result was obtained on the submitted specimen  and confirmed on repeat testing.  While 2019 novel coronavirus  (SARS-CoV-2) nucleic acids may be present in the submitted sample  additional confirmatory testing may be necessary for epidemiological  and / or clinical management purposes  to differentiate between  SARS-CoV-2 and other Sarbecovirus currently known to infect humans.  If clinically indicated additional testing  with an alternate test  methodology [phone]) is advised. The SARS-CoV-2 RNA is generally  detectable in upper and lower respiratory sp ecimens during the acute  phase of infection. The expected result is Negative. Fact Sheet for Patients:  StrictlyIdeas.no Fact Sheet for Healthcare Providers: BankingDealers.co.za This test is not yet approved or cleared by the [place] FDA and has been authorized for detection and/or diagnosis of SARS-CoV-2 by FDA under an Emergency Use Authorization (EUA).  This EUA will remain in effect (meaning this test can be used) for the duration of the COVID-19 declaration under Section 564(b)(1) of the Act, 21 U.S.C. section 360bbb-3(b)(1), unless the  authorization is terminated or revoked sooner. Performed at [facility], [place] [address] [place],  [address]      Labs: Basic Metabolic Panel: Recent Labs  Lab 12/25/18 0036 12/25/18 0409 12/25/18 0757 12/26/18 0509 12/27/18 0244  NA 135 136 134* 130* 136  K 4.3 3.8 3.6 3.7 3.0*  CL 105 107 107 100 103  CO2 15* 17* 19* 19* 25  GLUCOSE 187* 133* 139* 313* 149*  BUN '20 18 17 13 11  ' CREATININE 1.62* 1.43* 1.18 1.16 0.93  CALCIUM 9.1 9.4 8.9 8.3* 8.7*   Liver Function Tests: Recent Labs  Lab 12/24/18 1648  AST 16  ALT 27  ALKPHOS 99  BILITOT 1.6*  PROT 8.7*  ALBUMIN 4.5   Recent Labs  Lab 12/24/18 1648  LIPASE 39   No results for input(s): AMMONIA in the last 168 hours. CBC: Recent Labs  Lab 12/24/18 1533 12/24/18 1540 12/24/18 2053 12/26/18 0509  WBC  --  16.0* 16.8* 8.2  NEUTROABS  --  13.6*  --   --   HGB 20.4* 20.3* 20.0* 15.3  HCT 60.0* 60.1* 58.5* 44.0  MCV  --  84.4 83.7 82.2  PLT  --  286 295 202   Cardiac Enzymes: No results for input(s): CKTOTAL, CKMB, CKMBINDEX, TROPONINI in the last 168 hours. BNP: BNP (last 3 results) No results for input(s): BNP in the last 8760 hours.  ProBNP (last 3 results) No results for input(s): PROBNP in the last 8760 hours.  CBG: Recent Labs  Lab 12/26/18 1123 12/26/18 1627 12/26/18 2202 12/27/18 0632 12/27/18 1211  GLUCAP 455* 359* 149* 154* 320*       Signed:  Domenic Polite MD.  [facility] 12/27/2018, 2:47 PM

## 2019-10-22 ENCOUNTER — Ambulatory Visit: Payer: Medicare Other | Attending: Family

## 2019-10-22 DIAGNOSIS — Z23 Encounter for immunization: Secondary | ICD-10-CM

## 2019-10-22 NOTE — Progress Notes (Signed)
   Covid-19 Vaccination Clinic  Name:  HOLLY PRING    MRN: [id number] DOB: May 24, 1986  10/22/2019  Mr. Cowley was observed post Covid-19 immunization for 15 minutes without incident. He was provided with Vaccine Information Sheet and instruction to access the V-Safe system.   Mr. Vandergrift was instructed to call 911 with any severe reactions post vaccine: Marland Kitchen Difficulty breathing  . Swelling of face and throat  . A fast heartbeat  . A bad rash all over body  . Dizziness and weakness   Immunizations Administered    Name Date Dose VIS Date Route   Moderna COVID-19 Vaccine 10/22/2019  4:38 PM 0.5 mL 07/14/2019 Intramuscular   Manufacturer: Moderna   Lot: [id number]   NDC: 67425-525-89

## 2019-10-23 ENCOUNTER — Ambulatory Visit (INDEPENDENT_AMBULATORY_CARE_PROVIDER_SITE_OTHER): Payer: 59 | Admitting: Pulmonary Disease

## 2019-10-23 ENCOUNTER — Encounter: Payer: Self-pay | Admitting: Pulmonary Disease

## 2019-10-23 ENCOUNTER — Other Ambulatory Visit: Payer: Self-pay

## 2019-10-23 DIAGNOSIS — J4540 Moderate persistent asthma, uncomplicated: Secondary | ICD-10-CM

## 2019-10-23 DIAGNOSIS — J301 Allergic rhinitis due to pollen: Secondary | ICD-10-CM | POA: Diagnosis not present

## 2019-10-23 MED ORDER — MOMETASONE FUROATE 50 MCG/ACT NA SUSP: 2.0000 | Freq: Every day | NASAL | 11 refills | Status: DC

## 2019-10-23 MED ORDER — FLUTICASONE-SALMETEROL 250-50 MCG/DOSE IN AEPB: INHALATION_SPRAY | RESPIRATORY_TRACT | 3 refills | Status: DC

## 2019-10-23 MED ORDER — ALBUTEROL SULFATE HFA 108 (90 BASE) MCG/ACT IN AERS: 2.0000 | INHALATION_SPRAY | Freq: Four times a day (QID) | RESPIRATORY_TRACT | 6 refills | Status: AC | PRN

## 2019-10-23 NOTE — Assessment & Plan Note (Signed)
Plan: Restart Advair Refilled rescue inhaler Continue Nasonex nasal spray Follow-up with our office in 3 months, sooner if your breathing is not improving

## 2019-10-23 NOTE — Patient Instructions (Addendum)
You were seen today by Coral Ceo, NP  for:   I am glad that you called our office it was nice talking with you.  I completely agree we need to get you started back on your breathing medications.  I have refilled both of your inhalers.  Take your Advair as prescribed.  You can use your rescue inhaler as needed for shortness of breath or wheezing.  We will see you back in office in about 3 months.  Sooner if you are having worsening symptoms with your breathing.  If he has persistent wheezing or shortness of breath please contact our office to be scheduled for a office visit.  Once again thanks for calling, we were glad to help you!  Take care of yourself and stay safe,  Lainey Nelson  1. Moderate persistent asthma in adult without complication  - Fluticasone-Salmeterol (ADVAIR DISKUS) 250-50 MCG/DOSE AEPB; INHALE 1 PUFF INTO THE LUNGS 2 (TWO) TIMES DAILY.  Dispense: 180 each; Refill: 3 - albuterol (PROAIR HFA) 108 (90 Base) MCG/ACT inhaler; Inhale 2 puffs into the lungs every 6 (six) hours as needed.  Dispense: 6.7 g; Refill: 6  2. Allergic rhinitis due to pollen, unspecified seasonality  Continue Nasonex nasal spray   We recommend today:   Meds ordered this encounter  Medications  . Fluticasone-Salmeterol (ADVAIR DISKUS) 250-50 MCG/DOSE AEPB    Sig: INHALE 1 PUFF INTO THE LUNGS 2 (TWO) TIMES DAILY.    Dispense:  180 each    Refill:  3  . albuterol (PROAIR HFA) 108 (90 Base) MCG/ACT inhaler    Sig: Inhale 2 puffs into the lungs every 6 (six) hours as needed.    Dispense:  6.7 g    Refill:  6    Follow Up:    Return in about 3 months (around 01/23/2020), or if symptoms worsen or fail to improve, for Follow up with Dr. Delton Coombes.   Please do your part to reduce the spread of COVID-19:      Reduce your risk of any infection  and COVID19 by using the similar precautions used for avoiding the common cold or flu:  Marland Kitchen Wash your hands often with soap and warm water for at least 20 seconds.   If soap and water are not readily available, use an alcohol-based hand sanitizer with at least 60% alcohol.  . If coughing or sneezing, cover your mouth and nose by coughing or sneezing into the elbow areas of your shirt or coat, into a tissue or into your sleeve (not your hands). Drinda Butts A MASK when in public  . Avoid shaking hands with others and consider head nods or verbal greetings only. . Avoid touching your eyes, nose, or mouth with unwashed hands.  . Avoid close contact with people who are sick. . Avoid places or events with large numbers of people in one location, like concerts or sporting events. . If you have some symptoms but not all symptoms, continue to monitor at home and seek medical attention if your symptoms worsen. . If you are having a medical emergency, call 911.   ADDITIONAL HEALTHCARE OPTIONS FOR PATIENTS  [facility] / e-Visit: [email or web address]         [facility]: [phone]  [facility]: [phone]                   [facility]: [phone]     It is flu season:   >>> Best ways to  protect herself from the flu: Receive the yearly flu vaccine, practice good hand hygiene washing with soap and also using hand sanitizer when available, eat a nutritious meals, get adequate rest, hydrate appropriately   Please contact the office if your symptoms worsen or you have concerns that you are not improving.   Thank you for choosing [facility] for your healthcare, and for allowing [place] to partner with you on your healthcare journey. I am thankful to be able to provide care to you today.   Wyn Quaker FNP-C

## 2019-10-23 NOTE — Progress Notes (Signed)
Virtual Visit via Telephone Note  I connected with Grant [name] on 10/23/19 at 11:30 AM EST by telephone and verified that I am speaking with the correct person using two identifiers.  Location: Patient: Home Provider: Office [facility] - [address] [place], [address], [place], [place] [address]   I discussed the limitations, risks, security and privacy concerns of performing an evaluation and management service by telephone and the availability of in person appointments. I also discussed with the patient that there may be a patient responsible charge related to this service. The patient expressed understanding and agreed to proceed.  Patient consented to consult via telephone: Yes People present and their role in pt care: Pt    History of Present Illness:  34 year old male never smoker followed in our office for asthma and allergic rhinitis  Past medical history: Hypertension, narcolepsy (takes provigil), T2DM  Smoking history: Never smoker Maintenance: Advair 250 Patient of Dr. Delton Coombes  Chief complaint: Medication Management    34 year old male never smoker last seen in our office in December/2018 by Dr. Delton Coombes.  At that time he was completing a follow-up for his moderate persistent asthma.  He was doing well he was able to exercise as well as play sports.  He had not had any recent exacerbations or hospitalizations.  At that office visit he was encouraged to remain on Advair and encouraged to follow-up annually with our office.  He is also encouraged to remain on Nasonex.  Patient completing televisit with our office today.  He reports that he has been off of his inhalers for greater than 6 months.  He is wondering if these can be refilled.  He has been able to lease use his rescue inhaler occasionally he reports that he is was using it 2-3 times a day prior to running out.  He also has occasional nasal drainage and occasional wheezing.  He does not feel that he is worsened off  of his baseline.  Patient does have a past medical history of a left vocal cord paralysis where he was seen by ENT in 2003 for this.  Patient has received the first Covid vaccine  Observations/Objective:  02/12/2002 - CT head-left vocal cord paralysis  02/12/2002 -CT chest with contrast-clear CT chest, no masses or enlarged lymph nodes seen  12/24/2018-chest x-ray-no active pulmonary disease  Social History   Tobacco Use  Smoking Status Never Smoker  Smokeless Tobacco Never Used   Immunization History  Administered Date(s) Administered  . Influenza Whole 04/14/2011  . Influenza,inj,Quad PF,6+ Mos 05/07/2016, 05/13/2017  . Moderna SARS-COVID-2 Vaccination 10/22/2019    Assessment and Plan:  Moderate persistent asthma in adult without complication Plan: Restart Advair Refilled rescue inhaler Continue Nasonex nasal spray Follow-up with our office in 3 months, sooner if your breathing is not improving  Allergic rhinitis Plan: Continue Nasonex nasal spray   Follow Up Instructions:  Return in about 3 months (around 01/23/2020), or if symptoms worsen or fail to improve, for Follow up with Dr. Delton Coombes.   I discussed the assessment and treatment plan with the patient. The patient was provided an opportunity to ask questions and all were answered. The patient agreed with the plan and demonstrated an understanding of the instructions.   The patient was advised to call back or seek an in-person evaluation if the symptoms worsen or if the condition fails to improve as anticipated.  I provided 24 minutes of non-face-to-face time during this encounter.   Coral Ceo, NP

## 2019-10-23 NOTE — Assessment & Plan Note (Signed)
Plan: Continue Nasonex nasal spray

## 2019-11-24 ENCOUNTER — Ambulatory Visit: Payer: Medicare Other | Attending: Family

## 2019-11-24 DIAGNOSIS — Z23 Encounter for immunization: Secondary | ICD-10-CM

## 2019-11-24 NOTE — Progress Notes (Signed)
   Covid-19 Vaccination Clinic  Name:  Grant [name]    MRN: [id number] DOB: 1986/01/13  11/24/2019  [name] was observed post Covid-19 immunization for 15 minutes without incident. He was provided with Vaccine Information Sheet and instruction to access the V-Safe system.   [name] was instructed to call 911 with any severe reactions post vaccine: Marland Kitchen Difficulty breathing  . Swelling of face and throat  . A fast heartbeat  . A bad rash all over body  . Dizziness and weakness   Immunizations Administered    Name Date Dose VIS Date Route   Moderna COVID-19 Vaccine 11/24/2019  2:09 PM 0.5 mL 07/14/2019 Intramuscular   Manufacturer: Moderna   Lot: [id number]   NDC: 98286-751-98

## 2020-03-10 IMAGING — DX PORTABLE CHEST - 1 VIEW
1 series · 1 of 1 positions shown · non-contrast
Comparison: None.

CLINICAL DATA: Short of breath

EXAM:
PORTABLE CHEST 1 VIEW

[chest ap]
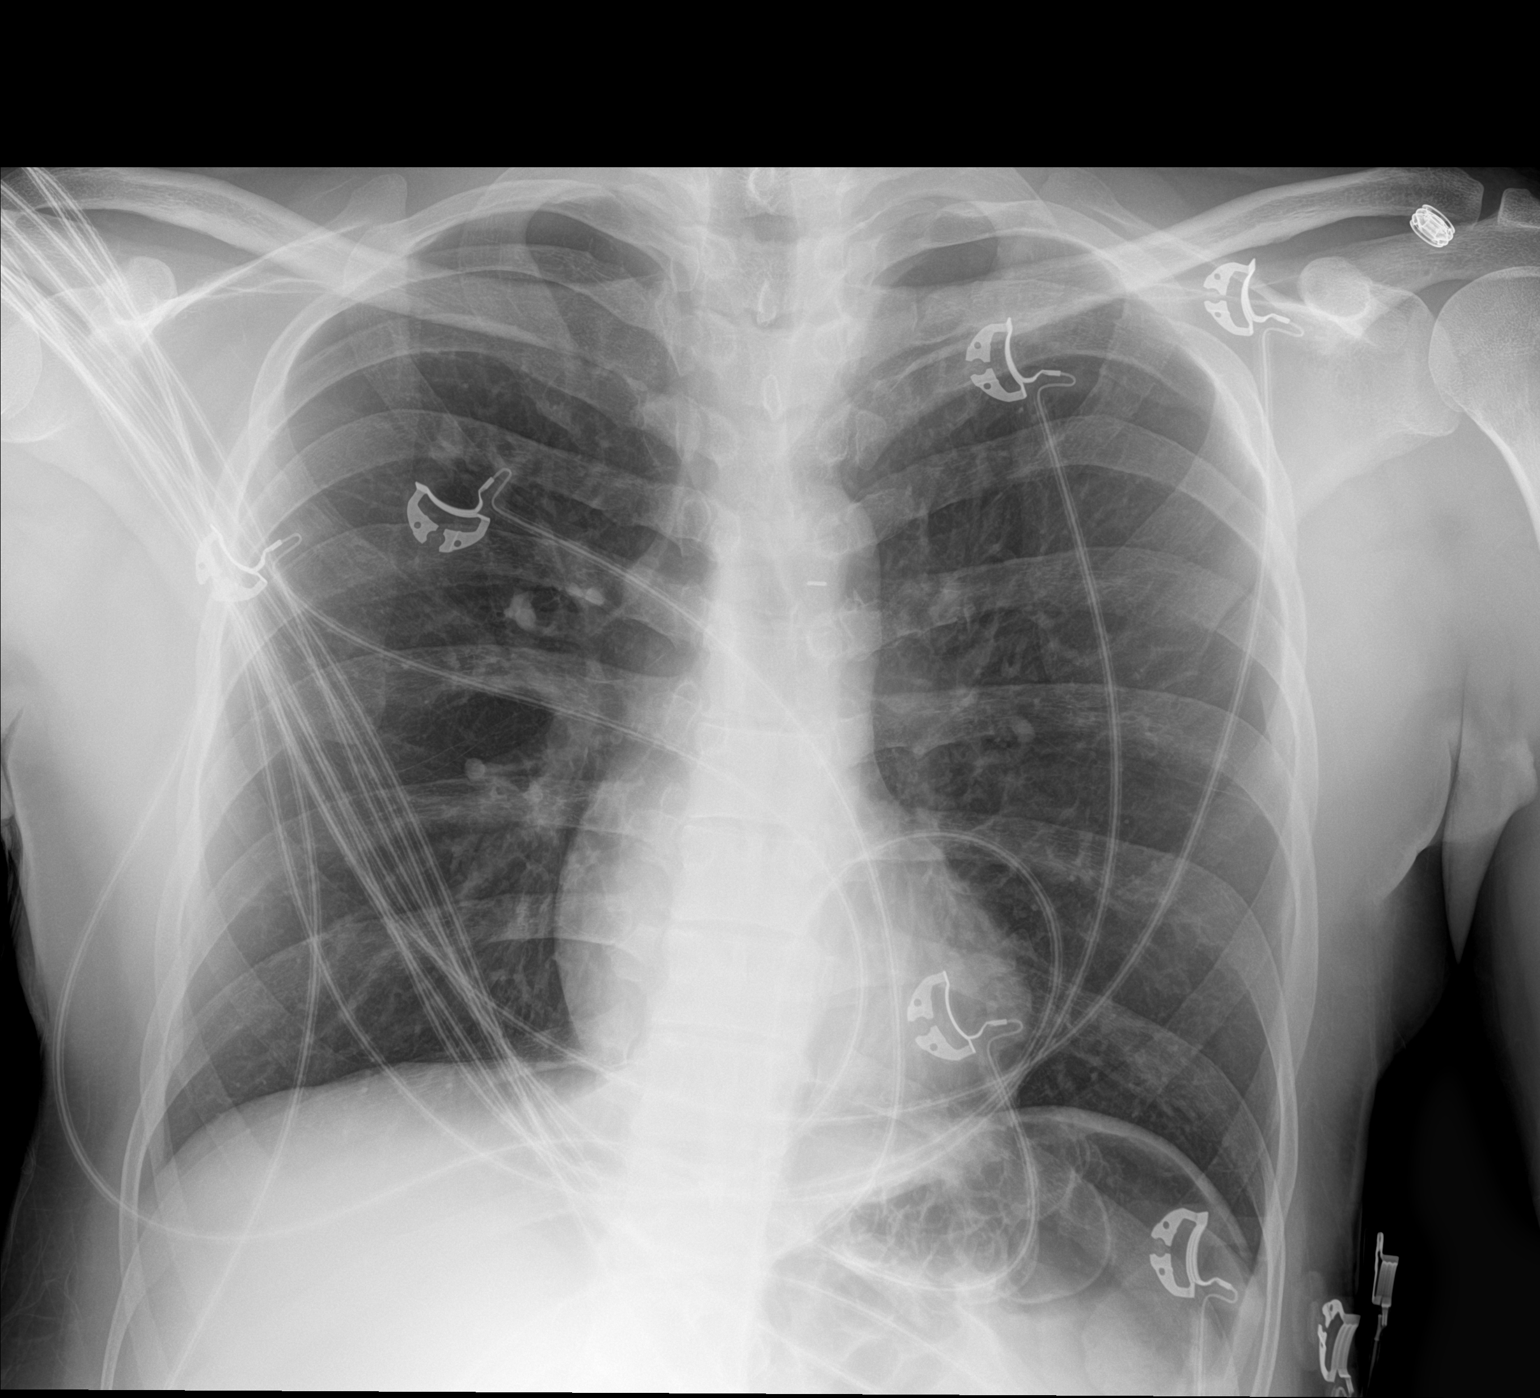

[1 of 1 positions shown; findings below may reference images not displayed]

FINDINGS: The heart size and mediastinal contours are within normal limits.
Both lungs are clear. The visualized skeletal structures are
unremarkable.
IMPRESSION: No active disease.

## 2020-03-15 ENCOUNTER — Encounter: Payer: Self-pay | Admitting: Neurology

## 2020-03-15 ENCOUNTER — Ambulatory Visit (INDEPENDENT_AMBULATORY_CARE_PROVIDER_SITE_OTHER): Payer: Managed Care, Other (non HMO) | Admitting: Neurology

## 2020-03-15 VITALS — BP 116/78 | HR 85 | Ht 67.0 in | Wt 158.0 lb

## 2020-03-15 DIAGNOSIS — G47419 Narcolepsy without cataplexy: Secondary | ICD-10-CM

## 2020-03-15 MED ORDER — MODAFINIL 200 MG PO TABS: ORAL_TABLET | ORAL | 5 refills | Status: DC

## 2020-03-15 NOTE — Patient Instructions (Signed)
Continue the Provigil  See you back in 1 year

## 2020-03-15 NOTE — Progress Notes (Signed)
PATIENT: Grant [name] DOB: [birth year]  REASON FOR VISIT: follow up HISTORY FROM: patient  HISTORY OF PRESENT ILLNESS: Today 03/15/20  [name] is a 34 year old male with history of narcolepsy.  He has been off Provigil for several months, he is here to get a refill, now that he has gone back to work.  He works part-time as a Astronomer at [employer].  He is prescribed Provigil 200 mg in the morning, 200 mg around noon or early afternoon.  Without the medication, he is tired, has fatigue.  He works from 2 PM to 7 PM.  He does not drive, takes public transportation.  Indicates he has been diagnosed with diabetes, is no longer on insulin.  Presents today for follow-up unaccompanied.  ESS was 7, FSS was 12 (previously, his scores have been low, but his mom has re-scored him, she is not here today).  HISTORY  11/13/2018 SS: [name] is a 34 year old male with history of narcolepsy.  He presented via video visit for a follow-up visit.  He remains on Provigil 200 mg in the morning, 200 mg around noon or early afternoon.  He reports the medication is beneficial. He denies any significant daytime sleepiness.  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  He reports sleepiness does not impact his job.  He reports he will go to bed around 9:00 pm and sleep for about 8 to 9 hours.  He currently lives with his family.  He does not drive a car, takes the bus for transportation.  He does see his primary care doctor yearly for physical, blood work.  He reports he has gotten a good report.  He denies any new problems or concerns.  He reports he is tolerating the medication well.  He denies any changes to his medical history or medications.  He reports he does manage his own medications.  REVIEW OF SYSTEMS: Out of a complete 14 system review of symptoms, the patient complains only of the following symptoms, and all other reviewed systems are  negative.  N/A  ALLERGIES: Allergies  Allergen Reactions  . Other Anaphylaxis, Shortness Of Breath and Swelling    CANNOT HAVE ANY NUTS (in any form) Once had an allergic reaction so bad his nail beds turned blue  . Peanut-Containing Drug Products Anaphylaxis, Shortness Of Breath and Swelling    HOME MEDICATIONS: Outpatient Medications Prior to Visit  Medication Sig Dispense Refill  . albuterol (PROAIR HFA) 108 (90 Base) MCG/ACT inhaler Inhale 2 puffs into the lungs every 6 (six) hours as needed. 6.7 g 6  . blood glucose meter kit and supplies Dispense based on patient and insurance preference. Use up to four times daily as directed. (FOR ICD-10 E10.9, E11.9). 1 each 0  . Fluticasone-Salmeterol (ADVAIR DISKUS) 250-50 MCG/DOSE AEPB INHALE 1 PUFF INTO THE LUNGS 2 (TWO) TIMES DAILY. 180 each 3  . insulin glargine (LANTUS) 100 unit/mL SOPN Inject 0.4 mLs (40 Units total) into the skin daily. 15 mL 1  . Insulin Pen Needle 32G X 4 MM MISC 1 each by Does not apply route daily. 30 each 1  . mometasone (NASONEX) 50 MCG/ACT nasal spray Place 2 sprays into the nose daily. 17 g 11  . Travoprost, BAK Free, (TRAVATAN) 0.004 % SOLN ophthalmic solution Place 1 drop into both eyes at bedtime.     . modafinil (PROVIGIL) 200 MG tablet TAKE 1 TABLET BY MOUTH IN  THE MORNING AND 1 TABLET  BETWEEN 12PM AND 3PM IN THE AFTERNOON (Patient taking differently: Take 200 mg by mouth See admin instructions. Take 200 mg by mouth in the morning and 200 mg between 12 NOON-3 PM daily) 60 tablet 5   No facility-administered medications prior to visit.    PAST MEDICAL HISTORY: Past Medical History:  Diagnosis Date  . Allergic rhinitis   . Asthma   . Autism   . 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 Narcolepsy   . Premature delivery    Born at 21 weeks of gestation    PAST SURGICAL HISTORY: Past Surgical History:  Procedure Laterality Date  . PATENT DUCTUS ARTERIOUS REPAIR       FAMILY HISTORY: Family History  Problem Relation Age of Onset  . Seizures Father   . Allergies Other        siblings  . Asthma Other        siblings    SOCIAL HISTORY: Social History   Socioeconomic History  . Marital status: Single    Spouse name: Not on file  . Number of children: 0  . Years of education: College  . Highest education level: Not on file  Occupational History  . Occupation: works part time at [employer]: [employer]   Tobacco Use  . Smoking status: Never Smoker  . Smokeless tobacco: Never Used  Substance and Sexual Activity  . Alcohol use: No    Alcohol/week: 0.0 standard drinks  . Drug use: No  . Sexual activity: Not on file  Other Topics Concern  . Not on file  Social History Narrative   Patient is single and lives with his parents.   Patient does not drink any caffeine.   Patient is right-handed.   Social Determinants of Health   Financial Resource Strain:   . Difficulty of Paying Living Expenses:   Food Insecurity:   . Worried About Charity fundraiser in the Last Year:   . Arboriculturist in the Last Year:   Transportation Needs:   . Film/video editor (Medical):   Marland Kitchen Lack of Transportation (Non-Medical):   Physical Activity:   . Days of Exercise per Week:   . Minutes of Exercise per Session:   Stress:   . Feeling of Stress :   Social Connections:   . Frequency of Communication with Friends and Family:   . Frequency of Social Gatherings with Friends and Family:   . Attends Religious Services:   . Active Member of Clubs or Organizations:   . Attends Archivist Meetings:   Marland Kitchen Marital Status:   Intimate Partner Violence:   . Fear of Current or Ex-Partner:   . Emotionally Abused:   Marland Kitchen Physically Abused:   . Sexually Abused:    PHYSICAL EXAM  Vitals:   03/15/20 0843  BP: 116/78  Pulse: 85  Weight: 158 lb (71.7 kg)  Height: _0  (1.702 m)   Body mass index is 24.75 kg/m.  Generalized: Well  developed, in no acute distress   Neurological examination  Mentation: Alert oriented to time, place, history taking. Follows all commands speech and language fluent. Voice is muffled. Cranial nerve II-XII: Pupils were equal round reactive to light. Extraocular movements were full, visual field were full on confrontational test. Facial sensation and strength were normal.  Head turning and shoulder shrug  were normal and symmetric. Motor: The motor testing reveals 5 over 5 strength of  all 4 extremities. Good symmetric motor tone is noted throughout.  Sensory: Sensory testing is intact to soft touch on all 4 extremities. No evidence of extinction is noted.  Coordination: Cerebellar testing reveals good finger-nose-finger and heel-to-shin bilaterally.  Gait and station: Gait is normal.  Reflexes: Deep tendon reflexes are symmetric and normal bilaterally.   DIAGNOSTIC DATA (LABS, IMAGING, TESTING) - I reviewed patient records, labs, notes, testing and imaging myself where available.  Lab Results  Component Value Date   WBC 8.2 12/26/2018   HGB 15.3 12/26/2018   HCT 44.0 12/26/2018   MCV 82.2 12/26/2018   PLT 202 12/26/2018      Component Value Date/Time   NA 136 12/27/2018 0244   K 3.0 (L) 12/27/2018 0244   CL 103 12/27/2018 0244   CO2 25 12/27/2018 0244   GLUCOSE 149 (H) 12/27/2018 0244   BUN 11 12/27/2018 0244   CREATININE 0.93 12/27/2018 0244   CALCIUM 8.7 (L) 12/27/2018 0244   PROT 8.7 (H) 12/24/2018 1648   ALBUMIN 4.5 12/24/2018 1648   AST 16 12/24/2018 1648   ALT 27 12/24/2018 1648   ALKPHOS 99 12/24/2018 1648   BILITOT 1.6 (H) 12/24/2018 1648   GFRNONAA >60 12/27/2018 0244   GFRAA >60 12/27/2018 0244   No results found for: CHOL, HDL, LDLCALC, LDLDIRECT, TRIG, CHOLHDL Lab Results  Component Value Date   HGBA1C 10.8 (H) 12/26/2018   No results found for: VITAMINB12 No results found for: TSH  ASSESSMENT AND PLAN 34 y.o. year old male  has a past medical history of  Allergic rhinitis, Asthma, Autism, Early stage glaucoma, Narcolepsy, and Premature delivery. here with:  1.  Narcolepsy  He has ran out of his medication.  I will refill Provigil 200 mg twice daily.  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  He has previously benefited well from the medicine.  He will follow-up in 1 year or sooner if needed.  I spent 20 minutes of face-to-face and non-face-to-face time with patient.  This included previsit chart review, lab review, study review, order entry, electronic health record documentation, patient education.  Butler Denmark, AGNP-C, DNP 03/15/2020, 9:17 AM [facility] [address] [place], [place] [address] [phone]

## 2020-03-17 ENCOUNTER — Telehealth: Payer: Self-pay

## 2020-03-17 NOTE — Telephone Encounter (Signed)
PA for modafinil has been sent to [facility] via cover my meds.  (Key: [id number])  Your information has been sent to [facility]. PA Case ID: [id number]

## 2020-03-23 ENCOUNTER — Telehealth: Payer: Self-pay | Admitting: Neurology

## 2020-03-23 NOTE — Telephone Encounter (Signed)
Grant [name],Grant [name](not on DPR but spoken to by RN on June of 2017) is asking for a call re: the status of the PA on pt's modafinil (PROVIGIL) 200 MG tablet.  She is wanting to know if there is someone she can call to help expedite the process

## 2020-03-23 NOTE — Telephone Encounter (Signed)
LMVm for pt and Licette that approval was given till 08-12-20.  Faxed approval letter to [facility] [address]. Call back if questions.

## 2020-03-23 NOTE — Telephone Encounter (Signed)
Approval on CMM modafinil 200mg  ID PA [id number]  Thru 08-12-20 MCR part D. 08-14-20 [facility].

## 2020-09-27 ENCOUNTER — Other Ambulatory Visit: Payer: Self-pay | Admitting: Neurology

## 2020-09-27 NOTE — Telephone Encounter (Signed)
Has f/u in one year 03-2021.

## 2020-12-25 ENCOUNTER — Other Ambulatory Visit: Payer: Self-pay | Admitting: Pulmonary Disease

## 2020-12-25 DIAGNOSIS — J4540 Moderate persistent asthma, uncomplicated: Secondary | ICD-10-CM

## 2021-03-16 ENCOUNTER — Encounter: Payer: Self-pay | Admitting: Adult Health

## 2021-03-16 ENCOUNTER — Other Ambulatory Visit: Payer: Self-pay

## 2021-03-16 ENCOUNTER — Ambulatory Visit (INDEPENDENT_AMBULATORY_CARE_PROVIDER_SITE_OTHER): Payer: Managed Care, Other (non HMO) | Admitting: Adult Health

## 2021-03-16 ENCOUNTER — Ambulatory Visit: Payer: Managed Care, Other (non HMO) | Admitting: Neurology

## 2021-03-16 VITALS — BP 116/80 | HR 81 | Ht 67.0 in | Wt 154.4 lb

## 2021-03-16 DIAGNOSIS — G47419 Narcolepsy without cataplexy: Secondary | ICD-10-CM | POA: Diagnosis not present

## 2021-03-16 NOTE — Patient Instructions (Signed)
Your Plan:  Continue Provigil If your symptoms worsen or you develop new symptoms please let us know.   Thank you for coming to see us at [facility]. I hope we have been able to provide you high quality care today.  You may receive a patient satisfaction survey over the next few weeks. We would appreciate your feedback and comments so that we may continue to improve ourselves and the health of our patients.  

## 2021-03-16 NOTE — Progress Notes (Signed)
PATIENT: Grant [name] DOB: 06/24/1986  REASON FOR VISIT: follow up HISTORY FROM: patient PRIMARY NEUROLOGIST: Dr. Brett Fairy  HISTORY OF PRESENT ILLNESS: Today 03/16/21:  Grant [name] is a 35 year old male with a history of narcolepsy.  He returns today for follow-up.  He is currently on Provigil 200 mg twice a day.  He reports that this works well for him.  He is no longer working at [employer].  He is in the process of trying to find a part-time job.  He does not operate a motor vehicle.  He is here today with his brother.  Overall he feels that he is doing well.  HISTORY ( Copied from Butler Denmark, NP): 03/15/20  Grant [name] is a 35 year old male with history of narcolepsy.  He has been off Provigil for several months, he is here to get a refill, now that he has gone back to work.  He works part-time as a Astronomer at [employer].  He is prescribed Provigil 200 mg in the morning, 200 mg around noon or early afternoon.  Without the medication, he is tired, has fatigue.  He works from 2 PM to 7 PM.  He does not drive, takes public transportation.  Indicates he has been diagnosed with diabetes, is no longer on insulin.  Presents today for follow-up unaccompanied.  ESS was 7, FSS was 12 (previously, his scores have been low, but his mom has re-scored him, she is not here today).  REVIEW OF SYSTEMS: Out of a complete 14 system review of symptoms, the patient complains only of the following symptoms, and all other reviewed systems are negative.  Epworth sleepiness score 5  ALLERGIES: Allergies  Allergen Reactions   Other Anaphylaxis, Shortness Of Breath and Swelling    CANNOT HAVE ANY NUTS (in any form) Once had an allergic reaction so bad his nail beds turned blue   Peanut-Containing Drug Products Anaphylaxis, Shortness Of Breath and Swelling    HOME MEDICATIONS: Outpatient Medications Prior to Visit  Medication Sig Dispense Refill   albuterol (PROAIR HFA) 108 (90  Base) MCG/ACT inhaler Inhale 2 puffs into the lungs every 6 (six) hours as needed. 6.7 g 6   blood glucose meter kit and supplies Dispense based on patient and insurance preference. Use up to four times daily as directed. (FOR ICD-10 E10.9, E11.9). 1 each 0   Fluticasone-Salmeterol (ADVAIR DISKUS) 250-50 MCG/DOSE AEPB INHALE 1 PUFF INTO THE LUNGS 2 (TWO) TIMES DAILY. 180 each 3   insulin glargine (LANTUS) 100 unit/mL SOPN Inject 0.4 mLs (40 Units total) into the skin daily. 15 mL 1   Insulin Pen Needle 32G X 4 MM MISC 1 each by Does not apply route daily. 30 each 1   modafinil (PROVIGIL) 200 MG tablet TAKE 1 TABLET BY MOUTH IN THE MORNING AND 1 TABLET BETWEEN 12PM AND 3PM IN THE AFTERNOON 60 tablet 5   mometasone (NASONEX) 50 MCG/ACT nasal spray Place 2 sprays into the nose daily. 17 g 11   Travoprost, BAK Free, (TRAVATAN) 0.004 % SOLN ophthalmic solution Place 1 drop into both eyes at bedtime.      No facility-administered medications prior to visit.    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   Narcolepsy    Premature delivery    Born at 21 weeks of gestation  PAST SURGICAL HISTORY: Past Surgical History:  Procedure Laterality Date   PATENT DUCTUS ARTERIOUS REPAIR      FAMILY HISTORY: Family History  Problem Relation Age of Onset   Seizures Father    Allergies Other        siblings   Asthma Other        siblings    SOCIAL HISTORY: Social History   Socioeconomic History   Marital status: Single    Spouse name: Not on file   Number of children: 0   Years of education: College   Highest education level: Not on file  Occupational History   Occupation: works part time at [employer]: [employer]   Tobacco Use   Smoking status: Never   Smokeless tobacco: Never  Substance and Sexual Activity   Alcohol use: No     Alcohol/week: 0.0 standard drinks   Drug use: No   Sexual activity: Not on file  Other Topics Concern   Not on file  Social History Narrative   Patient is single and lives with his parents.   Patient does not drink any caffeine.   Patient is right-handed.   Social Determinants of Health   Financial Resource Strain: Not on file  Food Insecurity: Not on file  Transportation Needs: Not on file  Physical Activity: Not on file  Stress: Not on file  Social Connections: Not on file  Intimate Partner Violence: Not on file      PHYSICAL EXAM  Vitals:   03/16/21 1010  BP: 116/80  Pulse: 81  Weight: 154 lb 6.4 oz (70 kg)  Height: '5\' 7"'  (1.702 m)   Body mass index is 24.18 kg/m.  Generalized: Well developed, in no acute distress   Neurological examination  Mentation: Alert oriented to time, place, history taking. Follows all commands speech and language fluent Cranial nerve II-XII: Pupils were equal round reactive to light. Extraocular movements were full, visual field were full on confrontational test. Facial sensation and strength were normal. Uvula tongue midline. Head turning and shoulder shrug  were normal and symmetric. Motor: The motor testing reveals 5 over 5 strength of all 4 extremities. Good symmetric motor tone is noted throughout.  Sensory: Sensory testing is intact to soft touch on all 4 extremities. No evidence of extinction is noted.  Coordination: Cerebellar testing reveals good finger-nose-finger and heel-to-shin bilaterally.  Gait and station: Gait is normal.  Reflexes: Deep tendon reflexes are symmetric and normal bilaterally.   DIAGNOSTIC DATA (LABS, IMAGING, TESTING) - I reviewed patient records, labs, notes, testing and imaging myself where available.  Lab Results  Component Value Date   WBC 8.2 12/26/2018   HGB 15.3 12/26/2018   HCT 44.0 12/26/2018   MCV 82.2 12/26/2018   PLT 202 12/26/2018      Component Value Date/Time   NA 136 12/27/2018 0244    K 3.0 (L) 12/27/2018 0244   CL 103 12/27/2018 0244   CO2 25 12/27/2018 0244   GLUCOSE 149 (H) 12/27/2018 0244   BUN 11 12/27/2018 0244   CREATININE 0.93 12/27/2018 0244   CALCIUM 8.7 (L) 12/27/2018 0244   PROT 8.7 (H) 12/24/2018 1648   ALBUMIN 4.5 12/24/2018 1648   AST 16 12/24/2018 1648   ALT 27 12/24/2018 1648   ALKPHOS 99 12/24/2018 1648   BILITOT 1.6 (H) 12/24/2018 1648   GFRNONAA >60 12/27/2018 0244   GFRAA >60 12/27/2018 0244    Lab Results  Component Value Date  HGBA1C 10.8 (H) 12/26/2018       ASSESSMENT AND PLAN 35 y.o. year old male  has a past medical history of Allergic rhinitis, Asthma, Autism, Early stage glaucoma, Narcolepsy, and Premature delivery. here with :  1.  Narcolepsy  --Continue Provigil 200 mg twice a day --Advised if symptoms worsen or he develops new symptoms he should let us know --Follow-up in 6 months or sooner if needed     Ward Givens, MSN, NP-C 03/16/2021, 10:05 AM [facility] [address], [place],  [address] [phone]

## 2021-03-31 ENCOUNTER — Other Ambulatory Visit: Payer: Self-pay | Admitting: Neurology

## 2021-04-30 ENCOUNTER — Other Ambulatory Visit: Payer: Self-pay | Admitting: Pulmonary Disease

## 2021-04-30 DIAGNOSIS — J301 Allergic rhinitis due to pollen: Secondary | ICD-10-CM

## 2021-06-27 ENCOUNTER — Other Ambulatory Visit: Payer: Self-pay | Admitting: Pulmonary Disease

## 2021-06-27 DIAGNOSIS — J301 Allergic rhinitis due to pollen: Secondary | ICD-10-CM

## 2021-06-28 NOTE — Telephone Encounter (Signed)
Patient needs to make an in office appt.

## 2021-07-07 ENCOUNTER — Other Ambulatory Visit: Payer: Self-pay | Admitting: Emergency Medicine

## 2021-07-07 DIAGNOSIS — J301 Allergic rhinitis due to pollen: Secondary | ICD-10-CM

## 2021-07-13 NOTE — Progress Notes (Addendum)
'@Patient'  ID: Grant [name], male    DOB: September 13, 1985, 35 y.o.   MRN: [id number]  Chief Complaint  Patient presents with   Follow-up    No concerns.    Referring provider: Seward Carol, MD  HPI: 35 year old male, never smoker followed for moderate persistent asthma and allergic rhinitis.  He is a patient of Dr. Agustina Caroli and was last seen via virtual visit by Warner Mccreedy, NP on 10/23/2019.  Past medical history significant for narcolepsy followed by neurology, hypertension, DMII.  TEST/EVENTS:  02/12/2002 CT head: Left vocal cord paralysis 02/12/2002 CT chest with contrast: Clear CT chest, no masses or enlarged lymph nodes seen 12/24/2018 CXR 1 view: Lungs are clear.  Heart size normal.  10/23/2019: Virtual visit with Mack,NP.  Reports he had been off of his inhalers for greater than 6 months and wanted to get them refilled.  Utilizing rescue inhaler occasionally.  Reported occasional nasal drainage and occasional wheezing.  He did not feel that he was worse compared to baseline.  Restarted Advair and refilled albuterol inhaler.  Continue Nasonex nasal spray.  Suggested follow-up in 3 months or as needed.  07/14/2021: Today - follow up/med refill Patient presents today for follow up of moderate asthma. He reports his breathing as stable and has not experienced an exacerbation in over a year. He denies shortness of breath, cough, orthopnea, PND or wheezing. He uses his Advair Twice daily with no issues. He rarely has to use his albuterol inhaler. His nasal symptoms have improved and are well-controlled on Nasonex nasal spray. Overall, he feels well and has no complaints or concerns.   ACT: 24   Allergies  Allergen Reactions   Other Anaphylaxis, Shortness Of Breath and Swelling    CANNOT HAVE ANY NUTS (in any form) Once had an allergic reaction so bad his nail beds turned blue   Peanut-Containing Drug Products Anaphylaxis, Shortness Of Breath and Swelling    Immunization History  Administered  Date(s) Administered   Influenza Whole 04/14/2011   Influenza,inj,Quad PF,6+ Mos 05/07/2016, 05/13/2017, 07/14/2021   Moderna Sars-Covid-2 Vaccination 10/22/2019, 11/24/2019, 09/19/2020   Td 07/26/2008   Tdap 03/15/2020    Past Medical History:  Diagnosis Date   Allergic rhinitis    Asthma    Autism    Early stage glaucoma    Secondary to complications of prematurity   Narcolepsy    Premature delivery    Born at 21 weeks of gestation    Tobacco History: Social History   Tobacco Use  Smoking Status Never  Smokeless Tobacco Never   Counseling given: Not Answered   Outpatient Medications Prior to Visit  Medication Sig Dispense Refill   albuterol (PROAIR HFA) 108 (90 Base) MCG/ACT inhaler Inhale 2 puffs into the lungs every 6 (six) hours as needed. 6.7 g 6   blood glucose meter kit and supplies Dispense based on patient and insurance preference. Use up to four times daily as directed. (FOR ICD-10 E10.9, E11.9). 1 each 0   insulin glargine (LANTUS) 100 unit/mL SOPN Inject 0.4 mLs (40 Units total) into the skin daily. 15 mL 1   Insulin Pen Needle 32G X 4 MM MISC 1 each by Does not apply route daily. 30 each 1   modafinil (PROVIGIL) 200 MG tablet TAKE 1 TABLET BY MOUTH IN THE MORNING AND 1 TABLET BETWEEN 12PM AND 3PM IN THE AFTERNOON 60 tablet 5   mometasone (NASONEX) 50 MCG/ACT nasal spray PLACE 2 SPRAYS INTO THE NOSE DAILY. 51 each 0  Travoprost, BAK Free, (TRAVATAN) 0.004 % SOLN ophthalmic solution Place 1 drop into both eyes at bedtime.      Fluticasone-Salmeterol (ADVAIR DISKUS) 250-50 MCG/DOSE AEPB INHALE 1 PUFF INTO THE LUNGS 2 (TWO) TIMES DAILY. 180 each 3   No facility-administered medications prior to visit.     Review of Systems:   Constitutional: No weight loss or gain, night sweats, fevers, chills, fatigue, or lassitude. HEENT: No headaches, difficulty swallowing, tooth/dental problems, or sore throat. No sneezing, itching, ear ache, nasal congestion, or post  nasal drip CV:  No chest pain, orthopnea, PND, swelling in lower extremities, anasarca, dizziness, palpitations, syncope Resp: No shortness of breath with exertion or at rest. No excess mucus or change in color of mucus. No productive or non-productive. No hemoptysis. No wheezing.  No chest wall deformity GI:  No heartburn, indigestion, abdominal pain, nausea, vomiting, diarrhea, change in bowel habits, loss of appetite, bloody stools.  GU: No dysuria, change in color of urine, urgency or frequency.  No flank pain, no hematuria  Skin: No rash, lesions, ulcerations MSK:  No joint pain or swelling.  No decreased range of motion.  No back pain. Neuro: No dizziness or lightheadedness.  Psych: No depression or anxiety. Mood stable.     Physical Exam:  BP 114/70 (BP Location: Right Arm, Patient Position: Sitting, Cuff Size: Normal)   Pulse 86   Temp 98.2 F (36.8 C) (Oral)   Ht '5\' 7"'  (1.702 m)   Wt 163 lb 6.4 oz (74.1 kg)   SpO2 99%   BMI 25.59 kg/m   GEN: Pleasant, interactive, well-nourished; in no acute distress. HEENT:  Chronic dysphonia related to vocal cord paralysis. Normocephalic and atraumatic. EACs patent bilaterally. TM pearly gray with present light reflex bilaterally. PERRLA. Sclera white. Nasal turbinates pink, moist and patent bilaterally. No rhinorrhea present. Oropharynx pink and moist, without exudate or edema. No lesions, ulcerations, or postnasal drip.  NECK:  Supple w/ fair ROM. No JVD present. Normal carotid impulses w/o bruits. Thyroid symmetrical with no goiter or nodules palpated. No lymphadenopathy.   CV: RRR, no m/r/g, no peripheral edema. Pulses intact, +2 bilaterally. No cyanosis, pallor or clubbing. PULMONARY:  Unlabored, regular breathing. Clear bilaterally A&P w/o wheezes/rales/rhonchi. No accessory muscle use. No dullness to percussion. GI: BS present and normoactive. Soft, non-tender to palpation. No organomegaly or masses detected. No CVA tenderness. MSK: No  erythema, warmth or tenderness. Cap refil <2 sec all extrem. No deformities or joint swelling noted.  Neuro: A/Ox3. No focal deficits noted.   Skin: Warm, no lesions or rashe Psych: Normal affect and behavior. Judgement and thought content appropriate.     Lab Results:  CBC    Component Value Date/Time   WBC 8.2 12/26/2018 0509   RBC 5.35 12/26/2018 0509   HGB 15.3 12/26/2018 0509   HCT 44.0 12/26/2018 0509   PLT 202 12/26/2018 0509   MCV 82.2 12/26/2018 0509   MCH 28.6 12/26/2018 0509   MCHC 34.8 12/26/2018 0509   RDW 11.6 12/26/2018 0509   LYMPHSABS 1.2 12/24/2018 1540   MONOABS 1.1 (H) 12/24/2018 1540   EOSABS 0.0 12/24/2018 1540   BASOSABS 0.0 12/24/2018 1540    BMET    Component Value Date/Time   NA 136 12/27/2018 0244   K 3.0 (L) 12/27/2018 0244   CL 103 12/27/2018 0244   CO2 25 12/27/2018 0244   GLUCOSE 149 (H) 12/27/2018 0244   BUN 11 12/27/2018 0244   CREATININE 0.93 12/27/2018 0244  CALCIUM 8.7 (L) 12/27/2018 0244   GFRNONAA >60 12/27/2018 0244   GFRAA >60 12/27/2018 0244    BNP No results found for: BNP   Imaging:  No results found.    No flowsheet data found.  No results found for: NITRICOXIDE      Assessment & Plan:   Moderate persistent asthma in adult without complication Well-controlled on current maintenance regimen. No concerns today. No recent exacerbations. Continue with current regimen.   Patient Instructions  Continue Advair 250 1 puff Twice daily, rinse after. Refill was sent to your pharmacy.  Continue albuterol inhaler 2 puffs as needed for shortness of breath or wheezing Continue Nasonex nasal spray 2 sprays each nostril daily  Asthma Action Plan in place Avoid triggers, when able.  Exercise encouraged. Notify if worsening symptoms upon exertion.  Notify and seek help if symptoms unrelieved by rescue inhaler.   Follow up in one year with Dr. Lamonte Sakai. If new or worsening symptoms develop, please contact office for  sooner follow up or seek emergency care.    Allergic rhinitis Well-controlled on current regimen. Could consider second gen antihistamine if symptoms worsened. No need to add or adjust at this point. See above plan.     Clayton Bibles, NP 07/14/2021  Pt aware and understands NP's role.

## 2021-07-14 ENCOUNTER — Other Ambulatory Visit: Payer: Self-pay

## 2021-07-14 ENCOUNTER — Encounter: Payer: Self-pay | Admitting: Nurse Practitioner

## 2021-07-14 ENCOUNTER — Ambulatory Visit (INDEPENDENT_AMBULATORY_CARE_PROVIDER_SITE_OTHER): Payer: 59 | Admitting: Nurse Practitioner

## 2021-07-14 VITALS — BP 114/70 | HR 86 | Temp 98.2°F | Ht 67.0 in | Wt 163.4 lb

## 2021-07-14 DIAGNOSIS — J301 Allergic rhinitis due to pollen: Secondary | ICD-10-CM | POA: Diagnosis not present

## 2021-07-14 DIAGNOSIS — J4540 Moderate persistent asthma, uncomplicated: Secondary | ICD-10-CM | POA: Diagnosis not present

## 2021-07-14 DIAGNOSIS — J4520 Mild intermittent asthma, uncomplicated: Secondary | ICD-10-CM | POA: Insufficient documentation

## 2021-07-14 DIAGNOSIS — Z23 Encounter for immunization: Secondary | ICD-10-CM

## 2021-07-14 MED ORDER — FLUTICASONE-SALMETEROL 250-50 MCG/ACT IN AEPB: 1.0000 | INHALATION_SPRAY | Freq: Two times a day (BID) | RESPIRATORY_TRACT | 11 refills | Status: DC

## 2021-07-14 NOTE — Patient Instructions (Addendum)
Continue Advair 250 1 puff Twice daily, rinse after. Refill was sent to your pharmacy.  Continue albuterol inhaler 2 puffs as needed for shortness of breath or wheezing Continue Nasonex nasal spray 2 sprays each nostril daily  Asthma Action Plan in place Avoid triggers, when able.  Exercise encouraged. Notify if worsening symptoms upon exertion.  Notify and seek help if symptoms unrelieved by rescue inhaler.   Follow up in one year with Dr. Delton Coombes. If new or worsening symptoms develop, please contact office for sooner follow up or seek emergency care.

## 2021-07-14 NOTE — Assessment & Plan Note (Signed)
Well-controlled on current maintenance regimen. No concerns today. No recent exacerbations. Continue with current regimen.   Patient Instructions  Continue Advair 250 1 puff Twice daily, rinse after. Refill was sent to your pharmacy.  Continue albuterol inhaler 2 puffs as needed for shortness of breath or wheezing Continue Nasonex nasal spray 2 sprays each nostril daily  Asthma Action Plan in place Avoid triggers, when able.  Exercise encouraged. Notify if worsening symptoms upon exertion.  Notify and seek help if symptoms unrelieved by rescue inhaler.   Follow up in one year with Dr. Delton Coombes. If new or worsening symptoms develop, please contact office for sooner follow up or seek emergency care.

## 2021-07-14 NOTE — Assessment & Plan Note (Signed)
Well-controlled on current regimen. Could consider second gen antihistamine if symptoms worsened. No need to add or adjust at this point. See above plan.

## 2021-08-31 ENCOUNTER — Other Ambulatory Visit: Payer: Self-pay | Admitting: Emergency Medicine

## 2021-08-31 DIAGNOSIS — J301 Allergic rhinitis due to pollen: Secondary | ICD-10-CM

## 2021-09-18 ENCOUNTER — Ambulatory Visit: Payer: Managed Care, Other (non HMO) | Admitting: Adult Health

## 2021-09-18 ENCOUNTER — Telehealth: Payer: Self-pay | Admitting: Adult Health

## 2021-09-18 NOTE — Telephone Encounter (Signed)
LVM and sent mychart msg informing pt of appointment being cancelled- NP out.

## 2021-10-27 ENCOUNTER — Other Ambulatory Visit: Payer: Self-pay | Admitting: Emergency Medicine

## 2021-10-27 DIAGNOSIS — J301 Allergic rhinitis due to pollen: Secondary | ICD-10-CM

## 2021-12-14 ENCOUNTER — Other Ambulatory Visit: Payer: Self-pay | Admitting: Adult Health

## 2021-12-14 NOTE — Telephone Encounter (Signed)
Pt's mother has rescheduled pt's f/u and he is on wait list. Pt's mother is asking for a refill of pt's modafinil (PROVIGIL) 200 MG tablet to [employer] [employer] [facility] ?

## 2021-12-18 MED ORDER — MODAFINIL 200 MG PO TABS: ORAL_TABLET | ORAL | 3 refills | Status: DC

## 2021-12-18 NOTE — Telephone Encounter (Signed)
I called mother of pt (she states that he has been taking (I relayed that from the drug registry last fill was 04-14-2021, he has not been taking recently (been sleepy a lot, so she investigated). Relayed that will refill to [facility] as mother did not want to go to [facility].  Will renew now and then will have 3 refills to get to appt.  Needs to keep appt for future refills.  Mother verbalized understanding.  Pt is autistic.  ?

## 2021-12-18 NOTE — Addendum Note (Signed)
Addended by: Guy Begin on: 12/18/2021 03:38 PM ? ? Modules accepted: Orders ? ?

## 2021-12-18 NOTE — Telephone Encounter (Addendum)
Next appt is 03-22-2022.  (Only used 2 months out of last prescription 6 months) from drug registry. Can refill up until his 03-22-2022 appt.  ?

## 2021-12-18 NOTE — Telephone Encounter (Signed)
Yes okay to refill however if he does not come for his follow-up appointment we will not be able to refill in the future ? ? ?

## 2021-12-21 ENCOUNTER — Telehealth: Payer: Self-pay

## 2021-12-21 NOTE — Telephone Encounter (Signed)
PA for modafinil 200 mg has been sent via cmm. ? ?(Key: [id number]) ? ?[facility] is reviewing your PA request. Typically an electronic response will be received within 24-72 hours. To check for an update later, open this request from your dashboard. ? ?You may close this dialog and return to your dashboard to perform other tasks. ?

## 2021-12-21 NOTE — Telephone Encounter (Signed)
PA has been approved. ? ?(Key: [id number]) ? ?This request has received a Favorable outcome. ? ?Please note any additional information provided by [facility] at the bottom of your screen. ?

## 2022-03-22 ENCOUNTER — Ambulatory Visit (INDEPENDENT_AMBULATORY_CARE_PROVIDER_SITE_OTHER): Payer: Managed Care, Other (non HMO) | Admitting: Adult Health

## 2022-03-22 ENCOUNTER — Encounter: Payer: Self-pay | Admitting: Adult Health

## 2022-03-22 VITALS — BP 110/77 | HR 80 | Ht 68.0 in | Wt 155.6 lb

## 2022-03-22 DIAGNOSIS — G47419 Narcolepsy without cataplexy: Secondary | ICD-10-CM

## 2022-03-22 NOTE — Progress Notes (Signed)
PATIENT: Grant [name] DOB: 1986/07/30  REASON FOR VISIT: follow up HISTORY FROM: patient PRIMARY NEUROLOGIST: Dr. Brett Fairy  Chief Complaint  Patient presents with   Follow-up    Pt in 18  pt here narcolepsy f/u   Pt states no questions or concerns for this visit      HISTORY OF PRESENT ILLNESS: Today 03/22/22:  Grant [name] is a 36 year old male with a history of narcolepsy.  He returns today for follow-up.  He reports that he continues to take Provigil 200 mg twice a day.  Reports that this continues to work well for him.  He does not operate a motor vehicle. No trouble sleeping at night. Currently not working. Lives at home with his parents.   03/16/21: Grant [name] is a 36 year old male with a history of narcolepsy.  He returns today for follow-up.  He is currently on Provigil 200 mg twice a day.  He reports that this works well for him.  He is no longer working at [employer].  He is in the process of trying to find a part-time job.  He does not operate a motor vehicle.  He is here today with his brother.  Overall he feels that he is doing well.  HISTORY ( Copied from Butler Denmark, NP): 03/15/20  Grant [name] is a 35 year old male with history of narcolepsy.  He has been off Provigil for several months, he is here to get a refill, now that he has gone back to work.  He works part-time as a Astronomer at [employer].  He is prescribed Provigil 200 mg in the morning, 200 mg around noon or early afternoon.  Without the medication, he is tired, has fatigue.  He works from 2 PM to 7 PM.  He does not drive, takes public transportation.  Indicates he has been diagnosed with diabetes, is no longer on insulin.  Presents today for follow-up unaccompanied.  ESS was 7, FSS was 12 (previously, his scores have been low, but his mom has re-scored him, she is not here today).  REVIEW OF SYSTEMS: Out of a complete 14 system review of symptoms, the patient complains only of the following  symptoms, and all other reviewed systems are negative.    ALLERGIES: Allergies  Allergen Reactions   Other Anaphylaxis, Shortness Of Breath and Swelling    CANNOT HAVE ANY NUTS (in any form) Once had an allergic reaction so bad his nail beds turned blue   Peanut-Containing Drug Products Anaphylaxis, Shortness Of Breath and Swelling    HOME MEDICATIONS: Outpatient Medications Prior to Visit  Medication Sig Dispense Refill   albuterol (PROAIR HFA) 108 (90 Base) MCG/ACT inhaler Inhale 2 puffs into the lungs every 6 (six) hours as needed. 6.7 g 6   blood glucose meter kit and supplies Dispense based on patient and insurance preference. Use up to four times daily as directed. (FOR ICD-10 E10.9, E11.9). 1 each 0   fluticasone-salmeterol (ADVAIR) 250-50 MCG/ACT AEPB Inhale 1 puff into the lungs every 12 (twelve) hours. 60 each 11   insulin glargine (LANTUS) 100 unit/mL SOPN Inject 0.4 mLs (40 Units total) into the skin daily. 15 mL 1   Insulin Pen Needle 32G X 4 MM MISC 1 each by Does not apply route daily. 30 each 1   modafinil (PROVIGIL) 200 MG tablet Take 1 tablet in AM, and again 12-3pm 60 tablet 3   mometasone (NASONEX) 50 MCG/ACT nasal spray PLACE 2 SPRAYS INTO THE NOSE DAILY. [place]  each 1   Travoprost, BAK Free, (TRAVATAN) 0.004 % SOLN ophthalmic solution Place 1 drop into both eyes at bedtime.      No facility-administered medications prior to visit.    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   Narcolepsy    Premature delivery    Born at 21 weeks of gestation    PAST SURGICAL HISTORY: Past Surgical History:  Procedure Laterality Date   PATENT DUCTUS ARTERIOUS REPAIR      FAMILY HISTORY: Family History  Problem Relation Age of Onset   Seizures Father    Allergies Other        siblings   Asthma Other        siblings   Narcolepsy Neg Hx      SOCIAL HISTORY: Social History   Socioeconomic History   Marital status: Single    Spouse name: Not on file   Number of children: 0   Years of education: College   Highest education level: Not on file  Occupational History   Occupation: works part time at [employer]: [employer]   Tobacco Use   Smoking status: Never   Smokeless tobacco: Never  Substance and Sexual Activity   Alcohol use: No    Alcohol/week: 0.0 standard drinks of alcohol   Drug use: No   Sexual activity: Not on file  Other Topics Concern   Not on file  Social History Narrative   Patient is single and lives with his parents.   Patient does not drink any caffeine.   Patient is right-handed.   Social Determinants of Health   Financial Resource Strain: Not on file  Food Insecurity: Not on file  Transportation Needs: Not on file  Physical Activity: Not on file  Stress: Not on file  Social Connections: Not on file  Intimate Partner Violence: Not on file      PHYSICAL EXAM  Vitals:   03/22/22 0845  BP: 110/77  Pulse: 80  Weight: 155 lb 9.6 oz (70.6 kg)  Height: _0  (1.727 m)   Body mass index is 23.66 kg/m.  Generalized: Well developed, in no acute distress   Neurological examination  Mentation: Alert oriented to time, place, history taking. Follows all commands speech and language fluent Cranial nerve II-XII: Pupils were equal round reactive to light. Extraocular movements were full, visual field were full on confrontational test. Facial sensation and strength were normal. Uvula tongue midline. Head turning and shoulder shrug  were normal and symmetric. Motor: The motor testing reveals 5 over 5 strength of all 4 extremities. Good symmetric motor tone is noted throughout.  Sensory: Sensory testing is intact to soft touch on all 4 extremities. No evidence of extinction is noted.  Coordination: Cerebellar testing reveals good finger-nose-finger and heel-to-shin bilaterally.   Gait and station: Gait is normal.  Reflexes: Deep tendon reflexes are symmetric and normal bilaterally.   DIAGNOSTIC DATA (LABS, IMAGING, TESTING) - I reviewed patient records, labs, notes, testing and imaging myself where available.  Lab Results  Component Value Date   WBC 8.2 12/26/2018   HGB 15.3 12/26/2018   HCT 44.0 12/26/2018   MCV 82.2 12/26/2018   PLT 202 12/26/2018      Component Value Date/Time   NA 136 12/27/2018 0244   K 3.0 (L) 12/27/2018 0244  CL 103 12/27/2018 0244   CO2 25 12/27/2018 0244   GLUCOSE 149 (H) 12/27/2018 0244   BUN 11 12/27/2018 0244   CREATININE 0.93 12/27/2018 0244   CALCIUM 8.7 (L) 12/27/2018 0244   PROT 8.7 (H) 12/24/2018 1648   ALBUMIN 4.5 12/24/2018 1648   AST 16 12/24/2018 1648   ALT 27 12/24/2018 1648   ALKPHOS 99 12/24/2018 1648   BILITOT 1.6 (H) 12/24/2018 1648   GFRNONAA >60 12/27/2018 0244   GFRAA >60 12/27/2018 0244    Lab Results  Component Value Date   HGBA1C 10.8 (H) 12/26/2018       ASSESSMENT AND PLAN 36 y.o. year old male  has a past medical history of Allergic rhinitis, Asthma, Autism, Early stage glaucoma, Narcolepsy, and Premature delivery. here with :  1.  Narcolepsy  --Continue Provigil 200 mg twice a day --Advised if symptoms worsen or he develops new symptoms he should let us know --Follow-up in 1 year or sooner if needed     Ward Givens, MSN, NP-C 03/22/2022, 8:52 AM [facility] [address] [place], [place] [address] [phone]

## 2022-03-22 NOTE — Patient Instructions (Signed)
Your Plan:  Continue provigil      Thank you for coming to see [place] at [facility]. I hope we have been able to provide you high quality care today.  You may receive a patient satisfaction survey over the next few weeks. We would appreciate your feedback and comments so that we may continue to improve ourselves and the health of our patients.

## 2022-05-10 ENCOUNTER — Other Ambulatory Visit: Payer: Self-pay | Admitting: Adult Health

## 2022-05-10 ENCOUNTER — Other Ambulatory Visit: Payer: Self-pay

## 2022-05-10 NOTE — Telephone Encounter (Addendum)
Last seen 03/22/22 Next visit 03/26/23  Per [place] registry, last filled on 05/07/2022 #60/30. Rx refill sent to [facility] NP.

## 2022-05-10 NOTE — Telephone Encounter (Signed)
Pt mother Elie Confer) called and left a voice mail. Requesting a refill on  modafinil (PROVIGIL) 200 MG tablet. Refill should be sent to [facility]

## 2022-05-10 NOTE — Telephone Encounter (Signed)
Pt just had an appt last month and has been checked in the registry.

## 2022-05-10 NOTE — Addendum Note (Signed)
Addended by: Gildardo Griffes on: 05/10/2022 10:45 AM   Modules accepted: Orders

## 2022-05-14 MED ORDER — MODAFINIL 200 MG PO TABS: ORAL_TABLET | ORAL | 5 refills | Status: DC

## 2022-08-08 ENCOUNTER — Other Ambulatory Visit: Payer: Self-pay | Admitting: Nurse Practitioner

## 2022-08-08 DIAGNOSIS — J4540 Moderate persistent asthma, uncomplicated: Secondary | ICD-10-CM

## 2022-08-21 ENCOUNTER — Telehealth: Payer: Self-pay | Admitting: Adult Health

## 2022-08-21 NOTE — Telephone Encounter (Signed)
Ok we can try to do a PA. Can you call pt's mother and ask if there have been any changes to his insurance for 2024? If so, we need the name of the plan, the Rx BIN, PCN, Group numbers from the card. Any insurance they have for prescription drug coverage we will need.

## 2022-08-21 NOTE — Telephone Encounter (Signed)
Pt's mother called stating that the insurance does not pay for the pt's modafinil (PROVIGIL) 200 MG tablet to be taken twice a day. They will only cover to be taken once a day. It pt is needing to continue to take twice a day then a PA will have be submitted.

## 2022-08-21 NOTE — Telephone Encounter (Signed)
After checking DPR phone rep called pt's mother to obtain the information Grant Starcher, RN asked for.  Office # left on vm asking for a call back.

## 2022-08-23 NOTE — Telephone Encounter (Signed)
Called pt mother and left a vm message to please call the office.

## 2022-08-27 NOTE — Telephone Encounter (Signed)
Pt mother called back. And stated nothing has changed for 2024. Stated he does need a PA to take medication twice a day.  [facility] Pres[facility] Drug [facility] - Coverage 08/13/22- Group #[id number] PCN# [id number] BIN [id number] ID: [id number].

## 2022-08-29 ENCOUNTER — Other Ambulatory Visit (HOSPITAL_COMMUNITY): Payer: Self-pay

## 2022-08-29 NOTE — Telephone Encounter (Signed)
   There is already an approved PA on file-per test billing:    No PA needed at this time.

## 2022-08-30 NOTE — Telephone Encounter (Addendum)
Pt has been taking  modafinil (PROVIGIL) 200 MG table BID. [place], spoke to [place] They Last filled Rx in October and he was taking BID and was previously approved for BID at that point. She stated a new PA was needed.

## 2022-08-31 ENCOUNTER — Other Ambulatory Visit (HOSPITAL_COMMUNITY): Payer: Self-pay

## 2022-08-31 NOTE — Telephone Encounter (Signed)
Per telephone outreach to [employer]-verified that there is an active PA on file good from 08/13/2022-08/13/2023 for two pills daily. I then called [facility] mail order and they verified the PA is active and they were able to process the fill.  Order# [id number]  PT should receive the medication between Jan 25th and Jan 28th at the latest. Otho Darner of 60 tabs/30DS. Copay is zero.

## 2022-09-03 NOTE — Telephone Encounter (Signed)
Noted  

## 2022-09-18 NOTE — Telephone Encounter (Signed)
I called mother  who is not on DPR, but does come with pt (not on last visit). Brother came with pt. Pt has autism, narcolepsy.  She voiced her discontent for the way our office from the 08-21-2022 message to now about getting pt his medication.  He needs this medication.  I relayed that I will forward to clin manager, but will send prescription for 90 day supply to [facility] for his medication.  [place] drug registry checked and last fill was 05-31-2022 #60.   Has 4 refills remaining. She had been in touch and was trying to get 90 day supply.  I see that a PA was  done for 60 tab/ 30day  approved.

## 2022-09-18 NOTE — Telephone Encounter (Signed)
Pt.'s mother called quite irate demanding to speak to someone in the office about her son's Modafinil She started comparing her education to the staff stating that she should not have to call the office to tell them how to do their jobs. She states the pt is needing his Modafinil asap and that the prescription needs to be sent to the pharmacy as a 90 day quantity 120 pills BID.

## 2022-09-18 NOTE — Addendum Note (Signed)
Addended by: Brandon Melnick on: 09/18/2022 02:57 PM   Modules accepted: Orders

## 2022-09-30 ENCOUNTER — Other Ambulatory Visit: Payer: Self-pay | Admitting: Nurse Practitioner

## 2022-09-30 DIAGNOSIS — J4540 Moderate persistent asthma, uncomplicated: Secondary | ICD-10-CM

## 2022-10-03 NOTE — Telephone Encounter (Signed)
Last OV 07/14/2021.  Patient needs OV.  Denied Wixela 250/50.

## 2022-10-04 ENCOUNTER — Other Ambulatory Visit: Payer: Self-pay | Admitting: Adult Health

## 2022-10-04 NOTE — Telephone Encounter (Signed)
Evelyn/ [facility] is calling stated she is notifying they are sending out a 10 day supply of modafinil (PROVIGIL) 200 MG tablet. Stated after the 5th day she is asking if she can use 1 refill to send out a 20 day supply. Stated pt original  modafinil (PROVIGIL) 200 MG tablet  is lost in the mail.

## 2022-10-08 NOTE — Telephone Encounter (Signed)
This is fine 

## 2022-10-09 NOTE — Telephone Encounter (Signed)
I spoke to pts mother.  He never got the medication.   She said that it sat in [facility] - not delivered since 09-20-2022, and she did not feel comfortable getting from [facility] mail order after that.   The insurance did not work at the [employer].  [facility] said to try [facility] they have [facility] and it is closer to them now.  I have from the Drug Registry that is was sent out 09-20-2022 #60, but did it or the other one that was mailed out from [facility].  I told her will [facility] [address]

## 2022-10-09 NOTE — Addendum Note (Signed)
Addended by: Brandon Melnick on: 10/09/2022 03:40 PM   Modules accepted: Orders

## 2022-10-09 NOTE — Telephone Encounter (Signed)
I called and spoke to N[facility] at [facility] ([facility] order).  Drug registry checked and last fill 09-20-2022 #60 (but pt did not receive mail order). See below note.  I followed up on call about this to [facility], she said that modafinil is out of stock , transferred to [facility].  (Should be able to get as over ride placed).  I called this pharmacy spoke to [facility], pharmacist and he was not able to bring pt up.  I called and LMVM for pt or mother of pt about modafinil if received. There is a active PA and also card inform from a 08-21-2022 note.

## 2022-10-09 NOTE — Telephone Encounter (Addendum)
Pt mother called back and stated pt hasn't received modafininl. She said please call her at [phone]

## 2022-10-10 MED ORDER — MODAFINIL 200 MG PO TABS: ORAL_TABLET | ORAL | 5 refills | Status: DC

## 2022-10-10 NOTE — Telephone Encounter (Signed)
I called mother of pt and let her know that [facility].  I relayed to check with them and see.  She appreciated the call back.

## 2022-10-29 ENCOUNTER — Other Ambulatory Visit: Payer: Self-pay | Admitting: Nurse Practitioner

## 2022-10-29 DIAGNOSIS — J4540 Moderate persistent asthma, uncomplicated: Secondary | ICD-10-CM

## 2022-12-13 DIAGNOSIS — Z5181 Encounter for therapeutic drug level monitoring: Secondary | ICD-10-CM | POA: Diagnosis not present

## 2022-12-13 DIAGNOSIS — E1169 Type 2 diabetes mellitus with other specified complication: Secondary | ICD-10-CM | POA: Diagnosis not present

## 2022-12-14 DIAGNOSIS — E1169 Type 2 diabetes mellitus with other specified complication: Secondary | ICD-10-CM | POA: Diagnosis not present

## 2023-01-01 ENCOUNTER — Other Ambulatory Visit: Payer: Self-pay | Admitting: Nurse Practitioner

## 2023-01-01 DIAGNOSIS — J4540 Moderate persistent asthma, uncomplicated: Secondary | ICD-10-CM

## 2023-03-21 ENCOUNTER — Other Ambulatory Visit: Payer: Self-pay | Admitting: Nurse Practitioner

## 2023-03-21 DIAGNOSIS — J4540 Moderate persistent asthma, uncomplicated: Secondary | ICD-10-CM

## 2023-03-21 DIAGNOSIS — J301 Allergic rhinitis due to pollen: Secondary | ICD-10-CM

## 2023-03-26 ENCOUNTER — Telehealth: Payer: Managed Care, Other (non HMO) | Admitting: Adult Health

## 2023-04-25 ENCOUNTER — Other Ambulatory Visit: Payer: Self-pay | Admitting: Adult Health

## 2023-04-25 MED ORDER — MODAFINIL 200 MG PO TABS: ORAL_TABLET | ORAL | 0 refills | Status: DC

## 2023-04-25 MED ORDER — MODAFINIL 200 MG PO TABS: ORAL_TABLET | ORAL | 0 refills | Status: AC

## 2023-04-25 NOTE — Telephone Encounter (Signed)
Last seen on 03/22/22 No follow up scheduled  Last filled on 03/13/23 Rx pending to be signed

## 2023-04-25 NOTE — Addendum Note (Signed)
Addended by: Aura Camps on: 04/25/2023 05:03 PM   Modules accepted: Orders

## 2023-04-25 NOTE — Addendum Note (Signed)
Addended by: Huston Foley on: 04/25/2023 05:10 PM   Modules accepted: Orders

## 2023-04-25 NOTE — Telephone Encounter (Signed)
Dr.Athar can you sign Rx, Aundra Millet approved today it but it printed.   Thanks!

## 2023-04-25 NOTE — Telephone Encounter (Signed)
I signed off on the Provigil prescription for 1 month, patient has not been seen in over 1 year in this clinic, I will route to his providers for review.

## 2023-04-30 ENCOUNTER — Other Ambulatory Visit: Payer: Self-pay | Admitting: Nurse Practitioner

## 2023-04-30 DIAGNOSIS — J301 Allergic rhinitis due to pollen: Secondary | ICD-10-CM

## 2023-04-30 DIAGNOSIS — J4540 Moderate persistent asthma, uncomplicated: Secondary | ICD-10-CM

## 2023-05-16 DIAGNOSIS — E1169 Type 2 diabetes mellitus with other specified complication: Secondary | ICD-10-CM | POA: Diagnosis not present

## 2023-05-16 DIAGNOSIS — Z23 Encounter for immunization: Secondary | ICD-10-CM | POA: Diagnosis not present

## 2023-07-13 ENCOUNTER — Other Ambulatory Visit: Payer: Self-pay | Admitting: Neurology

## 2023-07-15 ENCOUNTER — Encounter: Payer: Self-pay | Admitting: Emergency Medicine

## 2023-07-15 ENCOUNTER — Telehealth: Payer: Self-pay | Admitting: Nurse Practitioner

## 2023-07-15 ENCOUNTER — Ambulatory Visit
Admission: EM | Admit: 2023-07-15 | Discharge: 2023-07-15 | Disposition: A | Discharge status: Home / Self Care | Payer: Managed Care, Other (non HMO) | Attending: Physician Assistant | Admitting: Physician Assistant

## 2023-07-15 DIAGNOSIS — J45901 Unspecified asthma with (acute) exacerbation: Secondary | ICD-10-CM | POA: Diagnosis not present

## 2023-07-15 LAB — POCT FASTING CBG KUC MANUAL ENTRY: POCT Glucose (KUC): 205 mg/dL — AB (ref 70–99)

## 2023-07-15 MED ORDER — PREDNISONE 20 MG PO TABS
20.0000 mg | ORAL_TABLET | Freq: Every day | ORAL | 0 refills | Status: AC
Start: 2023-07-15 — End: 2023-07-20

## 2023-07-15 NOTE — Discharge Instructions (Signed)
  Monitor glucose while taking steroid.   Continue to use inhaler as needed.   Please follow up with primary care provider as soon as possible.

## 2023-07-15 NOTE — ED Triage Notes (Addendum)
Patient states he began wheezing and feeling like he couldn't breathe well starting yesterday. Denies any pain. States he can't remember if this has happened before. Doesn't appear in acute distress at rest, but does have to pause in sentences to take deep breaths. States he's unsure of how his CBGs have been recently.

## 2023-07-15 NOTE — Telephone Encounter (Signed)
I called the patient back. I received a verbal to speak with his mother Tobie Lords. I notified the patient's mother that we have not seen him since 2022, and we can not send in any medications unless we have see him in the last 6 months. I told her he will have to be seen at an urgent care or he can contact his PCP.  Nothing further needed.

## 2023-07-15 NOTE — Telephone Encounter (Signed)
Patient is experiencing shortness of breath and wheezing. Next available appointment isn't until January with Cobb or a doctor and that will not suffice. Please contact patient's mother with more information on what to do. [phone]

## 2023-07-15 NOTE — ED Provider Notes (Signed)
[facility]    CSN: [id number] Arrival date & time: 07/15/23  1711      History   Chief Complaint Chief Complaint  Patient presents with   Wheezing    HPI Grant [name] is a 37 y.o. male.   Patient here to take for evaluation of wheezing and suspected asthma exacerbation that started yesterday.  He denies any significant shortness of breath.  He has had some improvement with his inhaler.  He denies any fever.  He does not report any sore throat.  The history is provided by the patient.    Past Medical History:  Diagnosis Date   Allergic rhinitis    Asthma    Autism    Early stage glaucoma    Secondary to complications of prematurity   Narcolepsy    Premature delivery    Born at 21 weeks of gestation    Patient Active Problem List   Diagnosis Date Noted   DKA (diabetic ketoacidosis) (HCC) 12/24/2018   HTN (hypertension) 12/24/2018   Idiopathic moderate intellectual disability 03/19/2017   Narcolepsy 11/04/2013   Acute asthmatic bronchitis 06/02/2013   Allergic rhinitis 09/23/2007   Moderate persistent asthma in adult without complication 09/23/2007    Past Surgical History:  Procedure Laterality Date   PATENT DUCTUS ARTERIOUS REPAIR         Home Medications    Prior to Admission medications   Medication Sig Start Date End Date Taking? Authorizing Provider  predniSONE (DELTASONE) 20 MG tablet Take 1 tablet (20 mg total) by mouth daily with breakfast for 5 days. 07/15/23 07/20/23 Yes Tomi Bamberger, PA-C  albuterol [facility] HFA) 108 (90 Base) MCG/ACT inhaler Inhale 2 puffs into the lungs every 6 (six) hours as needed. 10/23/19   Coral Ceo, NP  blood glucose meter kit and supplies Dispense based on patient and insurance preference. Use up to four times daily as directed. (FOR ICD-10 E10.9, E11.9). 12/27/18   Zannie Cove, MD  fluticasone-salmeterol (ADVAIR) 250-50 MCG/ACT AEPB Inhale 1 puff into the lungs every 12 (twelve) hours. 07/14/21    Cobb, Ruby Cola, NP  insulin glargine (LANTUS) 100 unit/mL SOPN Inject 0.4 mLs (40 Units total) into the skin daily. 12/27/18   Zannie Cove, MD  Insulin Pen Needle 32G X 4 MM MISC 1 each by Does not apply route daily. 12/27/18   Zannie Cove, MD  modafinil (PROVIGIL) 200 MG tablet TAKE 1 TABLET IN THE MORNING AND 1 in the afternoon. 04/25/23   Huston Foley, MD  mometasone (NASONEX) 50 MCG/ACT nasal spray PLACE 2 SPRAYS INTO THE NOSE DAILY. 10/27/21   Cobb, Ruby Cola, NP  Travoprost, BAK Free, (TRAVATAN) 0.004 % SOLN ophthalmic solution Place 1 drop into both eyes at bedtime.  12/14/18   [provider]    Family History Family History  Problem Relation Age of Onset   Seizures Father    Allergies Other        siblings   Asthma Other        siblings   Narcolepsy Neg Hx     Social History Social History   Tobacco Use   Smoking status: Never   Smokeless tobacco: Never  Substance Use Topics   Alcohol use: No    Alcohol/week: 0.0 standard drinks of alcohol   Drug use: No     Allergies   Other and Peanut-containing drug products   Review of Systems Review of Systems  Constitutional:  Negative for chills and fever.  HENT:  Negative for congestion, ear pain and sore throat.   Eyes:  Negative for discharge and redness.  Respiratory:  Positive for cough, shortness of breath and wheezing.   Gastrointestinal:  Negative for diarrhea, nausea and vomiting.  Neurological:  Negative for numbness.     Physical Exam Triage Vital Signs ED Triage Vitals  Encounter Vitals Group     BP      Systolic BP Percentile      Diastolic BP Percentile      Pulse      Resp      Temp      Temp src      SpO2      Weight      Height      Head Circumference      Peak Flow      Pain Score      Pain Loc      Pain Education      Exclude from Growth Chart    No data found.  Updated Vital Signs BP (!) 149/87 (BP Location: Left Arm)   Pulse 97   Temp 98.2 F (36.8 C)  (Oral)   Resp 16   SpO2 98%      Physical Exam Vitals and nursing note reviewed.  Constitutional:      General: He is not in acute distress.    Appearance: Normal appearance. He is not ill-appearing.  HENT:     Head: Normocephalic and atraumatic.     Nose: Nose normal. No congestion or rhinorrhea.     Mouth/Throat:     Mouth: Mucous membranes are moist.     Pharynx: Oropharynx is clear. No oropharyngeal exudate or posterior oropharyngeal erythema.  Eyes:     Conjunctiva/sclera: Conjunctivae normal.  Cardiovascular:     Rate and Rhythm: Normal rate and regular rhythm.  Pulmonary:     Effort: Pulmonary effort is normal. No respiratory distress.     Breath sounds: Wheezing (rare. scattered) present. No rhonchi or rales.  Neurological:     Mental Status: He is alert.  Psychiatric:        Mood and Affect: Mood normal.        Behavior: Behavior normal.        Thought Content: Thought content normal.      UC Treatments / Results  Labs (all labs ordered are listed, but only abnormal results are displayed) Labs Reviewed  POCT FASTING CBG [facility] MANUAL ENTRY - Abnormal; Notable for the following components:      Result Value   POCT Glucose ([facility]) 205 (*)    All other components within normal limits    EKG   Radiology No results found.  Procedures Procedures (including critical care time)  Medications Ordered in UC Medications - No data to display  Initial Impression / Assessment and Plan / UC Course  I have reviewed the triage vital signs and the nursing notes.  Pertinent labs & imaging results that were available during my care of the patient were reviewed by me and considered in my medical decision making (see chart for details).    Will treat with low-dose steroid burst for coverage of asthma exacerbation and recommended he continue to use inhaler as needed.  Encouraged patient to monitor glucose as steroid may increase glucose levels.  Advise follow-up with PCP as  soon as possible.  Patient expressed understanding.  Final Clinical Impressions(s) / UC Diagnoses   Final diagnoses:  Asthma with acute exacerbation, unspecified asthma  severity, unspecified whether persistent     Discharge Instructions       Monitor glucose while taking steroid.   Continue to use inhaler as needed.   Please follow up with primary care provider as soon as possible.       ED Prescriptions     Medication Sig Dispense Auth. Provider   predniSONE (DELTASONE) 20 MG tablet Take 1 tablet (20 mg total) by mouth daily with breakfast for 5 days. 5 tablet Tomi Bamberger, PA-C      PDMP not reviewed this encounter.   Tomi Bamberger, PA-C 07/15/23 Paulo Fruit

## 2023-08-13 DIAGNOSIS — E1169 Type 2 diabetes mellitus with other specified complication: Secondary | ICD-10-CM | POA: Diagnosis not present

## 2023-09-05 ENCOUNTER — Encounter: Payer: Self-pay | Admitting: Nurse Practitioner

## 2023-09-05 ENCOUNTER — Ambulatory Visit (INDEPENDENT_AMBULATORY_CARE_PROVIDER_SITE_OTHER): Payer: Managed Care, Other (non HMO) | Admitting: Nurse Practitioner

## 2023-09-05 DIAGNOSIS — J4540 Moderate persistent asthma, uncomplicated: Secondary | ICD-10-CM | POA: Diagnosis not present

## 2023-09-05 DIAGNOSIS — E1169 Type 2 diabetes mellitus with other specified complication: Secondary | ICD-10-CM | POA: Diagnosis not present

## 2023-09-05 MED ORDER — FLUTICASONE-SALMETEROL 250-50 MCG/ACT IN AEPB
1.0000 | INHALATION_SPRAY | Freq: Two times a day (BID) | RESPIRATORY_TRACT | 11 refills | Status: DC
Start: 2023-09-05 — End: 2024-03-04

## 2023-09-05 NOTE — Patient Instructions (Signed)
Continue Albuterol inhaler 2 puffs or 3 mL neb every 6 hours as needed for shortness of breath or wheezing. Notify if symptoms persist despite rescue inhaler/neb use.  Continue Wixela 1 puff Twice daily. Brush tongue and rinse mouth afterwards Continue nasonex nasal spray as needed for congestion  Follow up in 6 months with Dr. Delton Coombes or Philis Nettle. If symptoms worsen, please contact office for sooner follow up or seek emergency care.

## 2023-09-05 NOTE — Assessment & Plan Note (Signed)
Recent mild exacerbation. Resolved and clinically improved following prednisone burst. He otherwise has been well controlled on current regimen. ACT 21. Wixela refilled today. Action plan in place. Encouraged to remain active.  Patient Instructions  Continue Albuterol inhaler 2 puffs or 3 mL neb every 6 hours as needed for shortness of breath or wheezing. Notify if symptoms persist despite rescue inhaler/neb use.  Continue Wixela 1 puff Twice daily. Brush tongue and rinse mouth afterwards Continue nasonex nasal spray as needed for congestion  Follow up in 6 months with Dr. Delton Coombes or Philis Nettle. If symptoms worsen, please contact office for sooner follow up or seek emergency care.

## 2023-09-05 NOTE — Progress Notes (Signed)
@Patient  ID: Grant [name], male    DOB: [birth year], 38 y.o.   MRN: [id number]  Chief Complaint  Patient presents with   Follow-up    Referring provider: Renford Dills, MD  HPI: 38 year old male, never smoker followed for moderate persistent asthma and allergic rhinitis.  He is a patient of Dr. Kavin Leech and was last seen in office 07/14/2021 by [facility] NP.  Past medical history significant for narcolepsy followed by neurology, hypertension, DMII.  TEST/EVENTS:  02/12/2002 CT head: Left vocal cord paralysis 02/12/2002 CT chest with contrast: Clear CT chest, no masses or enlarged lymph nodes seen 12/24/2018 CXR 1 view: Lungs are clear.  Heart size normal.  10/23/2019: Virtual visit with Mack,NP.  Reports he had been off of his inhalers for greater than 6 months and wanted to get them refilled.  Utilizing rescue inhaler occasionally.  Reported occasional nasal drainage and occasional wheezing.  He did not feel that he was worse compared to baseline.  Restarted Advair and refilled albuterol inhaler.  Continue Nasonex nasal spray.  Suggested follow-up in 3 months or as needed.  07/14/2021: OV with Mykira Hofmeister NP for follow up of moderate asthma. He reports his breathing as stable and has not experienced an exacerbation in over a year. He denies shortness of breath, cough, orthopnea, PND or wheezing. He uses his Advair Twice daily with no issues. He rarely has to use his albuterol inhaler. His nasal symptoms have improved and are well-controlled on Nasonex nasal spray. Overall, he feels well and has no complaints or concerns.  ACT: 24  09/05/2023: Today - follow up Patient presents today for follow-up.  He had contacted the office in December 2024 with reports of increased shortness of breath and wheezing.  No acute appointments were available.  He went to urgent care and was treated for asthma exacerbation with prednisone burst.  Since then, he feels like he is gone back to his baseline.  Breathing is doing  well.  Has some occasional wheezing.  Only has to use his albuterol rescue inhaler couple times a month.  Feels like he is well-controlled for the most part.  No significant cough or chest congestion.  Uses his Wixela twice a day.  ACT 21  Allergies  Allergen Reactions   Other Anaphylaxis, Shortness Of Breath and Swelling    CANNOT HAVE ANY NUTS (in any form) Once had an allergic reaction so bad his nail beds turned blue   Peanut-Containing Drug Products Anaphylaxis, Shortness Of Breath and Swelling    Immunization History  Administered Date(s) Administered   Influenza Whole 04/14/2011   Influenza,inj,Quad PF,6+ Mos 05/07/2016, 05/13/2017, 07/14/2021   Moderna Sars-Covid-2 Vaccination 10/22/2019, 11/24/2019, 09/19/2020   Td 07/26/2008   Tdap 03/15/2020    Past Medical History:  Diagnosis Date   Allergic rhinitis    Asthma    Autism    Early stage glaucoma    Secondary to complications of prematurity   Narcolepsy    Premature delivery    Born at 21 weeks of gestation    Tobacco History: Social History   Tobacco Use  Smoking Status Never  Smokeless Tobacco Never   Counseling given: Not Answered    Outpatient Medications Prior to Visit  Medication Sig Dispense Refill   albuterol (PROAIR HFA) 108 (90 Base) MCG/ACT inhaler Inhale 2 puffs into the lungs every 6 (six) hours as needed. 6.7 g 6   blood glucose meter kit and supplies Dispense based on patient and insurance preference. Use up to  four times daily as directed. (FOR ICD-10 E10.9, E11.9). 1 each 0   insulin glargine (LANTUS) 100 unit/mL SOPN Inject 0.4 mLs (40 Units total) into the skin daily. 15 mL 1   Insulin Pen Needle 32G X 4 MM MISC 1 each by Does not apply route daily. 30 each 1   modafinil (PROVIGIL) 200 MG tablet TAKE 1 TABLET IN THE MORNING AND 1 in the afternoon. 60 tablet 0   mometasone (NASONEX) 50 MCG/ACT nasal spray PLACE 2 SPRAYS INTO THE NOSE DAILY. 51 each 1   Travoprost, BAK Free, (TRAVATAN)  0.004 % SOLN ophthalmic solution Place 1 drop into both eyes at bedtime.      fluticasone-salmeterol (ADVAIR) 250-50 MCG/ACT AEPB Inhale 1 puff into the lungs every 12 (twelve) hours. 60 each 11   No facility-administered medications prior to visit.     Review of Systems:   Constitutional: No weight loss or gain, night sweats, fevers, chills, fatigue, or lassitude. HEENT: No headaches, difficulty swallowing, tooth/dental problems, or sore throat. No sneezing, itching, ear ache, nasal congestion, or post nasal drip CV:  No chest pain, orthopnea, PND, swelling in lower extremities, anasarca, dizziness, palpitations, syncope Resp: +rare wheeze. No shortness of breath with exertion or at rest. No excess mucus or change in color of mucus. No productive or non-productive. No hemoptysis. No chest wall deformity GI:  No heartburn, indigestion Skin: No rash, lesions, ulcerations MSK:  No joint pain or swelling.   Neuro: No dizziness or lightheadedness.  Psych: No depression or anxiety. Mood stable.     Physical Exam:  BP 131/84 (BP Location: Left Arm, Patient Position: Sitting, Cuff Size: Normal)   Pulse 81   Temp (!) 97.4 F (36.3 C) (Oral)   Ht 5\' 8"  (1.727 m)   Wt 154 lb (69.9 kg)   SpO2 97%   BMI 23.42 kg/m   GEN: Pleasant, interactive, well-nourished; in no acute distress. HEENT:  Chronic dysphonia related to vocal cord paralysis. Normocephalic and atraumatic. PERRLA. Sclera white. Nasal turbinates pink, moist and patent bilaterally. No rhinorrhea present. Oropharynx pink and moist, without exudate or edema. No lesions, ulcerations, or postnasal drip.  NECK:  Supple w/ fair ROM. No JVD present. Normal carotid impulses w/o bruits. Thyroid symmetrical with no goiter or nodules palpated. No lymphadenopathy.   CV: RRR, no m/r/g, no peripheral edema. Pulses intact, +2 bilaterally. No cyanosis, pallor or clubbing. PULMONARY:  Unlabored, regular breathing. Clear bilaterally A&P w/o  wheezes/rales/rhonchi. No accessory muscle use. No dullness to percussion. GI: BS present and normoactive. Soft, non-tender to palpation. No organomegaly or masses detected.  MSK: No erythema, warmth or tenderness. Cap refil <2 sec all extrem. No deformities or joint swelling noted.  Neuro: A/Ox3. No focal deficits noted.   Skin: Warm, no lesions or rashe Psych: Normal affect and behavior. Judgement and thought content appropriate.     Lab Results:  CBC    Component Value Date/Time   WBC 8.2 12/26/2018 0509   RBC 5.35 12/26/2018 0509   HGB 15.3 12/26/2018 0509   HCT 44.0 12/26/2018 0509   PLT 202 12/26/2018 0509   MCV 82.2 12/26/2018 0509   MCH 28.6 12/26/2018 0509   MCHC 34.8 12/26/2018 0509   RDW 11.6 12/26/2018 0509   LYMPHSABS 1.2 12/24/2018 1540   MONOABS 1.1 (H) 12/24/2018 1540   EOSABS 0.0 12/24/2018 1540   BASOSABS 0.0 12/24/2018 1540    BMET    Component Value Date/Time   NA 136 12/27/2018 0244  K 3.0 (L) 12/27/2018 0244   CL 103 12/27/2018 0244   CO2 25 12/27/2018 0244   GLUCOSE 149 (H) 12/27/2018 0244   BUN 11 12/27/2018 0244   CREATININE 0.93 12/27/2018 0244   CALCIUM 8.7 (L) 12/27/2018 0244   GFRNONAA >60 12/27/2018 0244   GFRAA >60 12/27/2018 0244    BNP No results found for: "BNP"   Imaging:  No results found.  Administration History     None           No data to display          No results found for: "NITRICOXIDE"      Assessment & Plan:   Moderate persistent asthma in adult without complication Recent mild exacerbation. Resolved and clinically improved following prednisone burst. He otherwise has been well controlled on current regimen. ACT 21. Wixela refilled today. Action plan in place. Encouraged to remain active.  Patient Instructions  Continue Albuterol inhaler 2 puffs or 3 mL neb every 6 hours as needed for shortness of breath or wheezing. Notify if symptoms persist despite rescue inhaler/neb use.  Continue Wixela  1 puff Twice daily. Brush tongue and rinse mouth afterwards Continue nasonex nasal spray as needed for congestion  Follow up in 6 months with Dr. Delton Coombes or Philis Nettle. If symptoms worsen, please contact office for sooner follow up or seek emergency care.       Noemi Chapel, NP 09/05/2023  Pt aware and understands NP's role.

## 2023-10-21 DIAGNOSIS — E1169 Type 2 diabetes mellitus with other specified complication: Secondary | ICD-10-CM | POA: Diagnosis not present

## 2023-12-02 DIAGNOSIS — E1169 Type 2 diabetes mellitus with other specified complication: Secondary | ICD-10-CM | POA: Diagnosis not present

## 2024-01-13 DIAGNOSIS — G47411 Narcolepsy with cataplexy: Secondary | ICD-10-CM | POA: Diagnosis not present

## 2024-01-13 DIAGNOSIS — L219 Seborrheic dermatitis, unspecified: Secondary | ICD-10-CM | POA: Diagnosis not present

## 2024-01-13 DIAGNOSIS — Z0000 Encounter for general adult medical examination without abnormal findings: Secondary | ICD-10-CM | POA: Diagnosis not present

## 2024-01-13 DIAGNOSIS — E1169 Type 2 diabetes mellitus with other specified complication: Secondary | ICD-10-CM | POA: Diagnosis not present

## 2024-01-13 DIAGNOSIS — G4730 Sleep apnea, unspecified: Secondary | ICD-10-CM | POA: Diagnosis not present

## 2024-03-04 ENCOUNTER — Ambulatory Visit: Payer: Managed Care, Other (non HMO) | Admitting: Nurse Practitioner

## 2024-03-04 ENCOUNTER — Encounter: Payer: Self-pay | Admitting: Nurse Practitioner

## 2024-03-04 DIAGNOSIS — J4540 Moderate persistent asthma, uncomplicated: Secondary | ICD-10-CM

## 2024-03-04 MED ORDER — FLUTICASONE-SALMETEROL 250-50 MCG/ACT IN AEPB: 1.0000 | INHALATION_SPRAY | Freq: Two times a day (BID) | RESPIRATORY_TRACT | 11 refills | Status: AC

## 2024-03-04 NOTE — Progress Notes (Signed)
 @Patient  ID: Grant [name], male    DOB: 1985-11-13, 38 y.o.   MRN: [id number]  Chief Complaint  Patient presents with   Asthma    Well controlled    Referring provider: Rexanne Ingle, MD  HPI: 38 year old male, never smoker followed for moderate persistent asthma and allergic rhinitis.  He is a patient of Dr. Lanny and was last seen in office 09/05/2023 by [facility] NP.  Past medical history significant for narcolepsy followed by neurology, hypertension, DMII.  TEST/EVENTS:  02/12/2002 CT head: Left vocal cord paralysis 02/12/2002 CT chest with contrast: Clear CT chest, no masses or enlarged lymph nodes seen 12/24/2018 CXR 1 view: Lungs are clear.  Heart size normal.  10/23/2019: Virtual visit with Mack,NP.  Reports he had been off of his inhalers for greater than 6 months and wanted to get them refilled.  Utilizing rescue inhaler occasionally.  Reported occasional nasal drainage and occasional wheezing.  He did not feel that he was worse compared to baseline.  Restarted Advair and refilled albuterol  inhaler.  Continue Nasonex  nasal spray.  Suggested follow-up in 3 months or as needed.  07/14/2021: OV with Deshonna Trnka NP for follow up of moderate asthma. He reports his breathing as stable and has not experienced an exacerbation in over a year. He denies shortness of breath, cough, orthopnea, PND or wheezing. He uses his Advair Twice daily with no issues. He rarely has to use his albuterol  inhaler. His nasal symptoms have improved and are well-controlled on Nasonex  nasal spray. Overall, he feels well and has no complaints or concerns.  ACT: 24  09/05/2023: OV with Anah Billard NP for follow-up.  He had contacted the office in December 2024 with reports of increased shortness of breath and wheezing.  No acute appointments were available.  He went to urgent care and was treated for asthma exacerbation with prednisone  burst.  Since then, he feels like he is gone back to his baseline.  Breathing is doing well.   Has some occasional wheezing.  Only has to use his albuterol  rescue inhaler couple times a month.  Feels like he is well-controlled for the most part.  No significant cough or chest congestion.  Uses his Wixela twice a day. ACT 21  03/04/2024: Today - follow up Discussed the use of AI scribe software for clinical note transcription with the patient, who gave verbal consent to proceed.  History of Present Illness Grant [name] is a 38 year old male with asthma who presents for a routine follow-up.  He has not needed any steroid treatments recently. He managed well during allergy season without exacerbations. No hospitalizations. No cough, wheezing, or nocturnal symptoms such as waking up feeling short of breath. Breathing is doing well.   He is not using his rescue inhaler and is adhering to his maintenance inhaler, Wixela, twice daily as prescribed.    Allergies  Allergen Reactions   Other Anaphylaxis, Shortness Of Breath and Swelling    CANNOT HAVE ANY NUTS (in any form) Once had an allergic reaction so bad his nail beds turned blue   Peanut-Containing Drug Products Anaphylaxis, Shortness Of Breath and Swelling    Immunization History  Administered Date(s) Administered   Influenza Whole 04/14/2011   Influenza,inj,Quad PF,6+ Mos 05/07/2016, 05/13/2017, 07/14/2021   Moderna Sars-Covid-2 Vaccination 10/22/2019, 11/24/2019, 09/19/2020   Td 07/26/2008   Tdap 03/15/2020    Past Medical History:  Diagnosis Date   Allergic rhinitis    Asthma    Autism  Early stage glaucoma    Secondary to complications of prematurity   Narcolepsy    Premature delivery    Born at 21 weeks of gestation    Tobacco History: Social History   Tobacco Use  Smoking Status Never  Smokeless Tobacco Never   Counseling given: Not Answered    Outpatient Medications Prior to Visit  Medication Sig Dispense Refill   albuterol  (PROAIR  HFA) 108 (90 Base) MCG/ACT inhaler Inhale 2 puffs into the  lungs every 6 (six) hours as needed. 6.7 g 6   blood glucose meter kit and supplies Dispense based on patient and insurance preference. Use up to four times daily as directed. (FOR ICD-10 E10.9, E11.9). 1 each 0   Continuous Glucose Receiver (DEXCOM G6 RECEIVER) DEVI as directed to check blood sugar three times a day     insulin  glargine (LANTUS ) 100 unit/mL SOPN Inject 0.4 mLs (40 Units total) into the skin daily. 15 mL 1   Insulin  Pen Needle 32G X 4 MM MISC 1 each by Does not apply route daily. 30 each 1   ketoconazole (NIZORAL) 2 % shampoo as directed Externally Once a day as needed for 7 days     metFORMIN (GLUCOPHAGE) 1000 MG tablet Take 1,000 mg by mouth 2 (two) times daily.     modafinil  (PROVIGIL ) 200 MG tablet TAKE 1 TABLET IN THE MORNING AND 1 in the afternoon. 60 tablet 0   mometasone  (NASONEX ) 50 MCG/ACT nasal spray PLACE 2 SPRAYS INTO THE NOSE DAILY. 51 each 1   OZEMPIC, 0.25 OR 0.5 MG/DOSE, 2 MG/3ML SOPN 0.5 mg Subcutaneous once a week; Duration: 28 days     Travoprost, BAK Free, (TRAVATAN) 0.004 % SOLN ophthalmic solution Place 1 drop into both eyes at bedtime.      fluticasone -salmeterol (ADVAIR) 250-50 MCG/ACT AEPB Inhale 1 puff into the lungs every 12 (twelve) hours. 60 each 11   No facility-administered medications prior to visit.     Review of Systems:   Constitutional: No weight loss or gain, night sweats, fevers, chills, fatigue, or lassitude. HEENT: No headaches CV:  No chest pain, orthopnea, PND Resp: No shortness of breath with exertion or at rest. No cough. No wheeze. No hemoptysis.  GI:  No heartburn, indigestion Neuro: No dizziness or lightheadedness.  Psych: No depression or anxiety. Mood stable.     Physical Exam:  BP 116/82 (BP Location: Left Arm, Patient Position: Sitting, Cuff Size: Normal)   Pulse 86   Ht 5' 8 (1.727 m)   Wt 147 lb 6.4 oz (66.9 kg)   SpO2 98%   BMI 22.41 kg/m   GEN: Pleasant, interactive, well-nourished; in no acute  distress. HEENT:  Chronic dysphonia related to vocal cord paralysis. Normocephalic and atraumatic. PERRLA. Sclera white. Nasal turbinates pink, moist and patent bilaterally. No rhinorrhea present. Oropharynx pink and moist, without exudate or edema. No lesions, ulcerations, or postnasal drip.  NECK:  Supple w/ fair ROM. No lymphadenopathy.   CV: RRR, no m/r/g, no peripheral edema. Pulses intact, +2 bilaterally. No cyanosis, pallor or clubbing. PULMONARY:  Unlabored, regular breathing. Clear bilaterally A&P w/o wheezes/rales/rhonchi. No accessory muscle use. No dullness to percussion. GI: BS present and normoactive. Soft, non-tender to palpation.  MSK: No erythema, warmth or tenderness. Cap refil <2 sec all extrem.  Neuro: A/Ox3. No focal deficits noted.   Skin: Warm, no lesions or rashe Psych: Normal affect and behavior. Judgement and thought content appropriate.     Lab Results:  CBC  Component Value Date/Time   WBC 8.2 12/26/2018 0509   RBC 5.35 12/26/2018 0509   HGB 15.3 12/26/2018 0509   HCT 44.0 12/26/2018 0509   PLT 202 12/26/2018 0509   MCV 82.2 12/26/2018 0509   MCH 28.6 12/26/2018 0509   MCHC 34.8 12/26/2018 0509   RDW 11.6 12/26/2018 0509   LYMPHSABS 1.2 12/24/2018 1540   MONOABS 1.1 (H) 12/24/2018 1540   EOSABS 0.0 12/24/2018 1540   BASOSABS 0.0 12/24/2018 1540    BMET    Component Value Date/Time   NA 136 12/27/2018 0244   K 3.0 (L) 12/27/2018 0244   CL 103 12/27/2018 0244   CO2 25 12/27/2018 0244   GLUCOSE 149 (H) 12/27/2018 0244   BUN 11 12/27/2018 0244   CREATININE 0.93 12/27/2018 0244   CALCIUM 8.7 (L) 12/27/2018 0244   GFRNONAA >60 12/27/2018 0244   GFRAA >60 12/27/2018 0244    BNP No results found for: BNP   Imaging:  No results found.  Administration History     None           No data to display          No results found for: NITRICOXIDE      Assessment & Plan:   Moderate persistent asthma in adult without  complication Well controlled on current regimen. Last exacerbation 07/2023. Wixela refilled today for 1 year. Action plan in place. Encouraged to remain active. Trigger prevention.   Patient Instructions   Continue Albuterol  inhaler 2 puffs or 3 mL neb every 6 hours as needed for shortness of breath or wheezing. Notify if symptoms persist despite rescue inhaler/neb use.  Continue Wixela 1 puff Twice daily. Brush tongue and rinse mouth afterwards. Refilled for a year Continue nasonex  nasal spray as needed for congestion   Follow up in one year with Dr. Shelah or Izetta Malachy PIETY. If symptoms worsen, please contact office for sooner follow up or seek emergency care.           Comer LULLA Malachy, NP 03/04/2024  Pt aware and understands NP's role.

## 2024-03-04 NOTE — Assessment & Plan Note (Signed)
 Well controlled on current regimen. Last exacerbation 07/2023. Wixela refilled today for 1 year. Action plan in place. Encouraged to remain active. Trigger prevention.   Patient Instructions   Continue Albuterol  inhaler 2 puffs or 3 mL neb every 6 hours as needed for shortness of breath or wheezing. Notify if symptoms persist despite rescue inhaler/neb use.  Continue Wixela 1 puff Twice daily. Brush tongue and rinse mouth afterwards. Refilled for a year Continue nasonex  nasal spray as needed for congestion   Follow up in one year with Dr. Shelah or Izetta Malachy PIETY. If symptoms worsen, please contact office for sooner follow up or seek emergency care.

## 2024-03-04 NOTE — Patient Instructions (Addendum)
 Continue Albuterol  inhaler 2 puffs or 3 mL neb every 6 hours as needed for shortness of breath or wheezing. Notify if symptoms persist despite rescue inhaler/neb use.  Continue Wixela 1 puff Twice daily. Brush tongue and rinse mouth afterwards. Refilled for a year Continue nasonex  nasal spray as needed for congestion   Follow up in one year with Dr. Shelah or Izetta Malachy PIETY. If symptoms worsen, please contact office for sooner follow up or seek emergency care.

## 2024-04-14 DIAGNOSIS — E1169 Type 2 diabetes mellitus with other specified complication: Secondary | ICD-10-CM | POA: Diagnosis not present

## 2024-04-14 DIAGNOSIS — G4730 Sleep apnea, unspecified: Secondary | ICD-10-CM | POA: Diagnosis not present
